# Patient Record
Sex: Male | Born: 1985 | Race: Black or African American | Hispanic: No | Marital: Married | Smoking: Never smoker
Health system: Southern US, Community
[De-identification: ages and names within clinical notes are randomized; demographics above are authoritative.]

## PROBLEM LIST (undated history)

## (undated) DIAGNOSIS — F419 Anxiety disorder, unspecified: Secondary | ICD-10-CM

## (undated) DIAGNOSIS — Z5189 Encounter for other specified aftercare: Secondary | ICD-10-CM

## (undated) DIAGNOSIS — K602 Anal fissure, unspecified: Secondary | ICD-10-CM

## (undated) DIAGNOSIS — R569 Unspecified convulsions: Secondary | ICD-10-CM

## (undated) DIAGNOSIS — F329 Major depressive disorder, single episode, unspecified: Secondary | ICD-10-CM

## (undated) DIAGNOSIS — T7840XA Allergy, unspecified, initial encounter: Secondary | ICD-10-CM

## (undated) DIAGNOSIS — E7800 Pure hypercholesterolemia, unspecified: Secondary | ICD-10-CM

## (undated) DIAGNOSIS — F32A Depression, unspecified: Secondary | ICD-10-CM

## (undated) HISTORY — DX: Encounter for other specified aftercare: Z51.89

## (undated) HISTORY — DX: Allergy, unspecified, initial encounter: T78.40XA

## (undated) HISTORY — DX: Major depressive disorder, single episode, unspecified: F32.9

## (undated) HISTORY — DX: Depression, unspecified: F32.A

## (undated) HISTORY — DX: Unspecified convulsions: R56.9

## (undated) HISTORY — PX: MOUTH SURGERY: SHX715

## (undated) HISTORY — DX: Anxiety disorder, unspecified: F41.9

## (undated) HISTORY — DX: Anal fissure, unspecified: K60.2

---

## 2007-05-23 ENCOUNTER — Emergency Department (HOSPITAL_COMMUNITY): Admission: EM | Admit: 2007-05-23 | Discharge: 2007-05-24 | Payer: Self-pay | Admitting: Emergency Medicine

## 2007-05-25 ENCOUNTER — Emergency Department (HOSPITAL_COMMUNITY): Admission: EM | Admit: 2007-05-25 | Discharge: 2007-05-25 | Payer: Self-pay | Admitting: Emergency Medicine

## 2007-07-27 ENCOUNTER — Emergency Department (HOSPITAL_COMMUNITY): Admission: EM | Admit: 2007-07-27 | Discharge: 2007-07-28 | Payer: Self-pay | Admitting: Family Medicine

## 2011-01-28 LAB — URINALYSIS, ROUTINE W REFLEX MICROSCOPIC
Glucose, UA: NEGATIVE
Hgb urine dipstick: NEGATIVE
Ketones, ur: >80 — AB
Leukocytes, UA: NEGATIVE
Nitrite: NEGATIVE
Protein, ur: 30 — AB
Specific Gravity, Urine: 1.031 — ABNORMAL HIGH
Urobilinogen, UA: >8 — ABNORMAL HIGH
pH: 6.5

## 2011-01-28 LAB — DIFFERENTIAL
Basophils Absolute: 0
Basophils Relative: 0
Eosinophils Absolute: 0
Eosinophils Relative: 0
Lymphocytes Relative: 5 — ABNORMAL LOW
Lymphs Abs: 0.5 — ABNORMAL LOW
Monocytes Absolute: 1.7 — ABNORMAL HIGH
Monocytes Relative: 18 — ABNORMAL HIGH
Neutro Abs: 7.4
Neutrophils Relative %: 77

## 2011-01-28 LAB — URINE MICROSCOPIC-ADD ON

## 2011-01-28 LAB — COMPREHENSIVE METABOLIC PANEL
ALT: 17
AST: 19
Albumin: 3.7
Alkaline Phosphatase: 56
BUN: 11
CO2: 25
Calcium: 9.1
Chloride: 104
Creatinine, Ser: 0.97
GFR calc non Af Amer: >60
Glucose, Bld: 105 — ABNORMAL HIGH
Potassium: 4.2
Sodium: 136
Total Bilirubin: 1.3 — ABNORMAL HIGH
Total Protein: 6.7

## 2011-01-28 LAB — CBC
HCT: 38.5 — ABNORMAL LOW
Hemoglobin: 13.1
MCHC: 34
MCV: 91.8
Platelets: 198
RBC: 4.19 — ABNORMAL LOW
RDW: 11.7
WBC: 9.6

## 2011-01-28 LAB — RAPID STREP SCREEN (MED CTR MEBANE ONLY): Streptococcus, Group A Screen (Direct): NEGATIVE

## 2011-01-28 LAB — LIPASE, BLOOD: Lipase: 18

## 2011-01-28 LAB — MONONUCLEOSIS SCREEN: Mono Screen: NEGATIVE

## 2011-01-31 LAB — POCT I-STAT, CHEM 8
BUN: 17
Calcium, Ion: 1.18
Chloride: 101
Creatinine, Ser: 1.1
Glucose, Bld: 89
HCT: 44
Hemoglobin: 15
Potassium: 3.9
Sodium: 135
TCO2: 25

## 2011-01-31 LAB — URINE MICROSCOPIC-ADD ON

## 2011-01-31 LAB — DIFFERENTIAL
Basophils Absolute: 0
Basophils Relative: 0
Eosinophils Absolute: 0
Eosinophils Relative: 0
Lymphocytes Relative: 18
Lymphs Abs: 1.1
Monocytes Absolute: 1.6 — ABNORMAL HIGH
Monocytes Relative: 27 — ABNORMAL HIGH
Neutro Abs: 3.2
Neutrophils Relative %: 55

## 2011-01-31 LAB — I-STAT 8, (EC8 V) (CONVERTED LAB)
BUN: 16
Bicarbonate: 24.1 — ABNORMAL HIGH
Chloride: 103
Glucose, Bld: 87
HCT: 43
Hemoglobin: 14.6
Operator id: 294511
Potassium: 4.2
Sodium: 134 — ABNORMAL LOW
TCO2: 25
pCO2, Ven: 37.5 — ABNORMAL LOW
pH, Ven: 7.416 — ABNORMAL HIGH

## 2011-01-31 LAB — CBC
HCT: 40.3
Hemoglobin: 13.8
MCHC: 34.2
MCV: 92
Platelets: 188
RBC: 4.39
RDW: 11.9
WBC: 5.9

## 2011-01-31 LAB — URINALYSIS, ROUTINE W REFLEX MICROSCOPIC
Glucose, UA: NEGATIVE
Hgb urine dipstick: NEGATIVE
Ketones, ur: 40 — AB
Leukocytes, UA: NEGATIVE
Nitrite: NEGATIVE
Protein, ur: 30 — AB
Specific Gravity, Urine: 1.033 — ABNORMAL HIGH
Urobilinogen, UA: 4 — ABNORMAL HIGH
pH: 6.5

## 2011-01-31 LAB — MONONUCLEOSIS SCREEN: Mono Screen: NEGATIVE

## 2011-01-31 LAB — POCT I-STAT CREATININE
Creatinine, Ser: 1
Operator id: 294511

## 2013-04-15 ENCOUNTER — Ambulatory Visit (INDEPENDENT_AMBULATORY_CARE_PROVIDER_SITE_OTHER): Payer: BC Managed Care – PPO | Admitting: Physician Assistant

## 2013-04-15 VITALS — BP 106/60 | HR 93 | Temp 98.5°F | Resp 18 | Ht 72.5 in | Wt 137.0 lb

## 2013-04-15 DIAGNOSIS — J069 Acute upper respiratory infection, unspecified: Secondary | ICD-10-CM

## 2013-04-15 MED ORDER — IPRATROPIUM BROMIDE 0.03 % NA SOLN: 2.0000 | Freq: Two times a day (BID) | NASAL | Status: DC

## 2013-04-15 MED ORDER — BENZONATATE 100 MG PO CAPS: 100.0000 mg | ORAL_CAPSULE | Freq: Three times a day (TID) | ORAL | Status: DC | PRN

## 2013-04-15 NOTE — Progress Notes (Signed)
Subjective:    Patient ID: Ralph Snow, male    DOB: 01/15/1986, 27 y.o.   MRN: [id number]  HPI   Mr. Warmuth is a very pleasant 27 yr old male here with concern for illness.  He reports that he feels that the worst has past, but that he continues to have some symptoms.  First became ill 4 days ago - symptoms include HA, congestion, scratchy throat, cough, body aches.  Low grade fever, tmax 99.84F but feels like his temperature was higher before that but he just hadn't taken his temperature yet.  Used migraine medicine for symptoms.  Continues to have some cough.  Nausea but no vomiting.  Appetite down but staying hydrated.  No known sick contacts.  No flu shot this year, declines today.  Works for [employer] in [place], wants to make sure he is ok to RTW.     Review of Systems  Constitutional: Negative for fever and chills.  HENT: Positive for congestion, rhinorrhea and sore throat. Negative for ear pain.   Respiratory: Positive for cough. Negative for shortness of breath and wheezing.   Cardiovascular: Negative.   Gastrointestinal: Positive for nausea. Negative for vomiting and diarrhea.  Musculoskeletal: Negative.   Skin: Negative.   Neurological: Positive for headaches.       Objective:   Physical Exam  Vitals reviewed. Constitutional: He is oriented to person, place, and time. He appears well-developed and well-nourished. No distress.  HENT:  Head: Normocephalic and atraumatic.  Right Ear: Tympanic membrane and ear canal normal.  Left Ear: Tympanic membrane and ear canal normal.  Nose: Mucosal edema and rhinorrhea present.  Mouth/Throat: Uvula is midline, oropharynx is clear and moist and mucous membranes are normal.  Eyes: Conjunctivae are normal. No scleral icterus.  Neck: Neck supple.  Cardiovascular: Normal rate, regular rhythm and normal heart sounds.   Pulmonary/Chest: Effort normal and breath sounds normal. He has no wheezes. He has no rales.  Abdominal: Soft. There is no  tenderness.  Lymphadenopathy:    He has no cervical adenopathy.  Neurological: He is alert and oriented to person, place, and time.  Skin: Skin is warm and dry.  Psychiatric: He has a normal mood and affect. His behavior is normal.        Assessment & Plan:  Viral URI with cough - Plan: ipratropium (ATROVENT) 0.03 % nasal spray, benzonatate (TESSALON) 100 MG capsule   Mr. Quevedo is a pleasant 27 yr old male here with 4 days of URI symptoms, now resolving.  Suspect viral etiology.  Possible that this could have been influenza, but with symptoms improving I have not tested him and he is outside the window for treatment.  Encouraged continued supportive care.  Tessalon and Atrovent for symptoms.  Work note provided.  RTC if worsening or not improving.   Meds ordered this encounter  Medications  . ibuprofen (ADVIL) 200 MG tablet    Sig: Take 200 mg by mouth every 6 (six) hours as needed.  Marland Kitchen ipratropium (ATROVENT) 0.03 % nasal spray    Sig: Place 2 sprays into the nose 2 (two) times daily.    Dispense:  30 mL    Refill:  1    Order Specific Question:  Supervising Provider    Answer:  Ethelda Chick [[id number]]  . benzonatate (TESSALON) 100 MG capsule    Sig: Take 1-2 capsules (100-200 mg total) by mouth 3 (three) times daily as needed for cough.    Dispense:  40 capsule  Refill:  0    Order Specific Question:  Supervising Provider    Answer:  Ethelda Chick [[id number]]    Loleta Dicker MHS, PA-C [facility] 12/8/20143:50 PM

## 2013-04-15 NOTE — Patient Instructions (Signed)
Ipratropium (Atrovent) nasal spray 2-3 times daily for relief of nasal congestion and post-nasal drainage.  Benzonatate (Tessalon) every 8 hours as needed for cough  Continue ibuprofen if needed for headache  Plenty of fluids (water is best!) and rest.  If worsening (fever >101.19F, worsening cough, shortness of breath, worsening pain) or not improving, please let us know.     Upper Respiratory Infection, Adult An upper respiratory infection (URI) is also sometimes known as the common cold. The upper respiratory tract includes the nose, sinuses, throat, trachea, and bronchi. Bronchi are the airways leading to the lungs. Most people improve within 1 week, but symptoms can last up to 2 weeks. A residual cough may last even longer.  CAUSES Many different viruses can infect the tissues lining the upper respiratory tract. The tissues become irritated and inflamed and often become very moist. Mucus production is also common. A cold is contagious. You can easily spread the virus to others by oral contact. This includes kissing, sharing a glass, coughing, or sneezing. Touching your mouth or nose and then touching a surface, which is then touched by another person, can also spread the virus. SYMPTOMS  Symptoms typically develop 1 to 3 days after you come in contact with a cold virus. Symptoms vary from person to person. They may include:  Runny nose.  Sneezing.  Nasal congestion.  Sinus irritation.  Sore throat.  Loss of voice (laryngitis).  Cough.  Fatigue.  Muscle aches.  Loss of appetite.  Headache.  Low-grade fever. DIAGNOSIS  You might diagnose your own cold based on familiar symptoms, since most people get a cold 2 to 3 times a year. Your caregiver can confirm this based on your exam. Most importantly, your caregiver can check that your symptoms are not due to another disease such as strep throat, sinusitis, pneumonia, asthma, or epiglottitis. Blood tests, throat tests, and X-rays  are not necessary to diagnose a common cold, but they may sometimes be helpful in excluding other more serious diseases. Your caregiver will decide if any further tests are required. RISKS AND COMPLICATIONS  You may be at risk for a more severe case of the common cold if you smoke cigarettes, have chronic heart disease (such as heart failure) or lung disease (such as asthma), or if you have a weakened immune system. The very young and very old are also at risk for more serious infections. Bacterial sinusitis, middle ear infections, and bacterial pneumonia can complicate the common cold. The common cold can worsen asthma and chronic obstructive pulmonary disease (COPD). Sometimes, these complications can require emergency medical care and may be life-threatening. PREVENTION  The best way to protect against getting a cold is to practice good hygiene. Avoid oral or hand contact with people with cold symptoms. Wash your hands often if contact occurs. There is no clear evidence that vitamin C, vitamin E, echinacea, or exercise reduces the chance of developing a cold. However, it is always recommended to get plenty of rest and practice good nutrition. TREATMENT  Treatment is directed at relieving symptoms. There is no cure. Antibiotics are not effective, because the infection is caused by a virus, not by bacteria. Treatment may include:  Increased fluid intake. Sports drinks offer valuable electrolytes, sugars, and fluids.  Breathing heated mist or steam (vaporizer or shower).  Eating chicken soup or other clear broths, and maintaining good nutrition.  Getting plenty of rest.  Using gargles or lozenges for comfort.  Controlling fevers with ibuprofen or acetaminophen as directed  by your caregiver.  Increasing usage of your inhaler if you have asthma. Zinc gel and zinc lozenges, taken in the first 24 hours of the common cold, can shorten the duration and lessen the severity of symptoms. Pain medicines  may help with fever, muscle aches, and throat pain. A variety of non-prescription medicines are available to treat congestion and runny nose. Your caregiver can make recommendations and may suggest nasal or lung inhalers for other symptoms.  HOME CARE INSTRUCTIONS   Only take over-the-counter or prescription medicines for pain, discomfort, or fever as directed by your caregiver.  Use a warm mist humidifier or inhale steam from a shower to increase air moisture. This may keep secretions moist and make it easier to breathe.  Drink enough water and fluids to keep your urine clear or pale yellow.  Rest as needed.  Return to work when your temperature has returned to normal or as your caregiver advises. You may need to stay home longer to avoid infecting others. You can also use a face mask and careful hand washing to prevent spread of the virus. SEEK MEDICAL CARE IF:   After the first few days, you feel you are getting worse rather than better.  You need your caregiver's advice about medicines to control symptoms.  You develop chills, worsening shortness of breath, or brown or red sputum. These may be signs of pneumonia.  You develop yellow or brown nasal discharge or pain in the face, especially when you bend forward. These may be signs of sinusitis.  You develop a fever, swollen neck glands, pain with swallowing, or white areas in the back of your throat. These may be signs of strep throat. SEEK IMMEDIATE MEDICAL CARE IF:   You have a fever.  You develop severe or persistent headache, ear pain, sinus pain, or chest pain.  You develop wheezing, a prolonged cough, cough up blood, or have a change in your usual mucus (if you have chronic lung disease).  You develop sore muscles or a stiff neck. Document Released: 10/19/2000 Document Revised: 07/18/2011 Document Reviewed: 08/27/2010 [facility] Patient Information 2014 [place], [place].

## 2013-07-20 ENCOUNTER — Ambulatory Visit: Payer: BC Managed Care – PPO

## 2013-07-20 ENCOUNTER — Ambulatory Visit (INDEPENDENT_AMBULATORY_CARE_PROVIDER_SITE_OTHER): Payer: BC Managed Care – PPO | Admitting: Family Medicine

## 2013-07-20 VITALS — BP 104/68 | HR 75 | Temp 98.2°F | Resp 17 | Ht 72.5 in | Wt 143.2 lb

## 2013-07-20 DIAGNOSIS — S300XXA Contusion of lower back and pelvis, initial encounter: Secondary | ICD-10-CM

## 2013-07-20 DIAGNOSIS — K5900 Constipation, unspecified: Secondary | ICD-10-CM

## 2013-07-20 DIAGNOSIS — J309 Allergic rhinitis, unspecified: Secondary | ICD-10-CM

## 2013-07-20 DIAGNOSIS — M778 Other enthesopathies, not elsewhere classified: Secondary | ICD-10-CM

## 2013-07-20 DIAGNOSIS — M545 Low back pain, unspecified: Secondary | ICD-10-CM

## 2013-07-20 DIAGNOSIS — G4710 Hypersomnia, unspecified: Secondary | ICD-10-CM

## 2013-07-20 DIAGNOSIS — R0683 Snoring: Secondary | ICD-10-CM

## 2013-07-20 DIAGNOSIS — R0989 Other specified symptoms and signs involving the circulatory and respiratory systems: Secondary | ICD-10-CM

## 2013-07-20 DIAGNOSIS — M79609 Pain in unspecified limb: Secondary | ICD-10-CM

## 2013-07-20 DIAGNOSIS — M79643 Pain in unspecified hand: Secondary | ICD-10-CM

## 2013-07-20 DIAGNOSIS — M779 Enthesopathy, unspecified: Secondary | ICD-10-CM

## 2013-07-20 DIAGNOSIS — R0609 Other forms of dyspnea: Secondary | ICD-10-CM

## 2013-07-20 LAB — POCT CBC
Granulocyte percent: 46.8 %G (ref 37–80)
HCT, POC: 42.9 % — AB (ref 43.5–53.7)
Hemoglobin: 13.8 g/dL — AB (ref 14.1–18.1)
Lymph, poc: 2.2 (ref 0.6–3.4)
MCH, POC: 30.6 pg (ref 27–31.2)
MCHC: 32.2 g/dL (ref 31.8–35.4)
MCV: 95.2 fL (ref 80–97)
MID (cbc): 0.4 (ref 0–0.9)
MPV: 8.9 fL (ref 0–99.8)
POC Granulocyte: 2.2 (ref 2–6.9)
POC LYMPH PERCENT: 45.5 %L (ref 10–50)
POC MID %: 7.7 %M (ref 0–12)
Platelet Count, POC: 260 10*3/uL (ref 142–424)
RBC: 4.51 M/uL — AB (ref 4.69–6.13)
RDW, POC: 12.3 %
WBC: 4.8 10*3/uL (ref 4.6–10.2)

## 2013-07-20 LAB — POCT URINALYSIS DIPSTICK
Bilirubin, UA: NEGATIVE
Blood, UA: NEGATIVE
Glucose, UA: NEGATIVE
Ketones, UA: NEGATIVE
Leukocytes, UA: NEGATIVE
Nitrite, UA: NEGATIVE
Spec Grav, UA: 1.02
Urobilinogen, UA: 1
pH, UA: 7

## 2013-07-20 MED ORDER — NAPROXEN 500 MG PO TABS: 500.0000 mg | ORAL_TABLET | Freq: Two times a day (BID) | ORAL | Status: DC

## 2013-07-20 MED ORDER — FLUTICASONE PROPIONATE 50 MCG/ACT NA SUSP: 2.0000 | Freq: Every day | NASAL | Status: DC

## 2013-07-20 NOTE — Patient Instructions (Signed)

## 2013-07-20 NOTE — Progress Notes (Addendum)
Subjective:    Patient ID: Ralph Snow, male    DOB: 10-Sep-1985, 28 y.o.   MRN: [id number]  HPI Chief Complaint  Patient presents with   Back Injury   Sleeping Problem   This chart was scribed for Ethelda Chick, MD by Andrew Au, ED Scribe. This patient was seen in room 1 and the patient's care was started at 4:28 PM.  HPI Comments: Ralph Snow is a 28 y.o. male who presents to the [facility] complaining of back injury onset 6-7 days ago. Pt states that he fell backward with significant force and hit his back on the corner of a cabinet. He reports soreness and pain with movement to lower back and flank. He denies radiation to legs, numbness or tingling. Pt took tylenol this morning for back pain. Pt report chronic constipation with recent worsening. Pt reports that he has been going every other day. Pt does not drink plenty of water. Pt does eat food with high fibers and fruit. Pt denies emesis, nausea, and diarrhea.  Pt is also complaining of left fourth finger pain. He reports pain with bending his finger. Pt denies injury to finger or hand. Pt is unsure where it came from. He states that he denies swelling, numbness and tingling. Pt writes with his right hand. He denies taking medication. He reports that his finger currently feels better than how it felt earlier this week.    Pt also complains of trouble sleeping. Pt reports that he does not get enough sleep. Pt feels like he does not feel rested in the morning even after 7 hours of sleep . He denies having trouble falling asleep. Pt states that he snores. Pt states that he has congestion in which he uses nose strips when sleeping. Pt denies napping during the day. Pt states he falls asleep during car rides. Pt reports falling asleep while having a conversation, but states that this does not occur often.  Pt denies chronic illness, asthma. Pt had seizures when he was younger and states that his last episode was when  he was 11 or 12. He denies hospitalization. Pt reports his father has anxiety problem. Pt reports mother has had surgery on thyroid. Pt reports healthy sisters and brother. Pt does not smoke, drink or do drugs. Pt works for [employer].  Pt is married.   Past Medical History  Diagnosis Date   Anxiety    Blood transfusion without reported diagnosis    Seizures    No Known Allergies Prior to Admission medications   Medication Sig Start Date End Date Taking? Authorizing Provider  acetaminophen (TYLENOL) 325 MG tablet Take 650 mg by mouth every 6 (six) hours as needed.   Yes Historical Provider, MD   Review of Systems  Gastrointestinal: Positive for abdominal pain and constipation. Negative for nausea, vomiting, diarrhea and blood in stool.  Genitourinary: Positive for flank pain.  Musculoskeletal: Positive for arthralgias and back pain.  Neurological: Negative for weakness and numbness.  Psychiatric/Behavioral: Positive for sleep disturbance.   History   Social History   Marital Status: Single    Spouse Name: N/A    Number of Children: N/A   Years of Education: N/A   Occupational History   employed     Education officer, community   Social History Main Topics   Smoking status: Never Smoker    Smokeless tobacco: Not on file   Alcohol Use: No   Drug Use: No  Sexual Activity: Yes   Other Topics Concern   Not on file   Social History Narrative   No narrative on file   Family History  Problem Relation Age of Onset   Anxiety disorder Father        Objective:   Physical Exam  Nursing note and vitals reviewed. Constitutional: He is oriented to person, place, and time. He appears well-developed and well-nourished. No distress.  HENT:  Head: Normocephalic and atraumatic.  Mouth/Throat: Oropharynx is clear and moist.  Eyes: Conjunctivae and EOM are normal. Pupils are equal, round, and reactive to light.  Neck: Normal range of motion. Neck  supple. No tracheal deviation present. No thyromegaly present.  Cardiovascular: Normal rate, regular rhythm and normal heart sounds.   No murmur heard. Pulmonary/Chest: Effort normal and breath sounds normal. No respiratory distress. He has no wheezes. He has no rales.  Abdominal: Soft. Bowel sounds are normal. He exhibits no distension and no mass. There is tenderness in the right lower quadrant. There is no rebound and no guarding.  Musculoskeletal: Normal range of motion.       Lumbar back: He exhibits tenderness, bony tenderness and pain. He exhibits normal range of motion and no spasm.  L HAND: +Fourth MCP of left hand tender to palpitation without swelling, ecchymoses. Full extension and flexion of fourth digit LUMBAR SPINE: +Superficial abrasion at L2 area; +Mild tenderness to palpitation right paraspinal region; straight leg raises negative; toe and heel walking intact; marching intact.  Lymphadenopathy:    He has no cervical adenopathy.  Neurological: He is alert and oriented to person, place, and time.  Skin: Skin is warm and dry.  Psychiatric: He has a normal mood and affect. His behavior is normal.      Results for orders placed in visit on 07/20/13  POCT CBC      Result Value Ref Range   WBC 4.8  4.6 - 10.2 K/uL   Lymph, poc 2.2  0.6 - 3.4   POC LYMPH PERCENT 45.5  10 - 50 %L   MID (cbc) 0.4  0 - 0.9   POC MID % 7.7  0 - 12 %M   POC Granulocyte 2.2  2 - 6.9   Granulocyte percent 46.8  37 - 80 %G   RBC 4.51 (*) 4.69 - 6.13 M/uL   Hemoglobin 13.8 (*) 14.1 - 18.1 g/dL   HCT, POC 16.142.9 (*) 09.643.5 - 53.7 %   MCV 95.2  80 - 97 fL   MCH, POC 30.6  27 - 31.2 pg   MCHC 32.2  31.8 - 35.4 g/dL   RDW, POC 04.512.3     Platelet Count, POC 260  142 - 424 K/uL   MPV 8.9  0 - 99.8 fL  POCT URINALYSIS DIPSTICK      Result Value Ref Range   Color, UA yellow     Clarity, UA clear     Glucose, UA neg     Bilirubin, UA neg     Ketones, UA neg     Spec Grav, UA 1.020     Blood, UA neg       pH, UA 7.0     Protein, UA trace     Urobilinogen, UA 1.0     Nitrite, UA neg     Leukocytes, UA Negative     [facility] reading (PRIMARY) by  Dr. Katrinka BlazingSmith.  LUMBAR SPINE:  NAD.    Assessment & Plan:   1. Lower back pain  2. Hand pain   3. Snoring   4. Hypersomnolence     1.  Low back pain/contusion:  New.  Supportive care with rest, ice, stretches, NSAIDs. Rx for Naproxen 500mg  bid with food provided. 2.  L hand pain/L hand tendonitis:  New.  Rx for Naproxen provided; recommend rest, NSAIDs, avoid overuse. 3.  Snoring: New. Associated with nasal congestion, hypersomnolence.  Recommend sleep study. 4.  Hypersomnolence:  New.  Recommend sleep study; obtain labs.  Associated with snoring. 5. Allergic Rhinitis:  New.  Rx for Flonase provided. 6. Constipation: worsening; recommend increased water intake and stool softener as needed.  I personally performed the services described in this documentation, which was scribed in my presence.  The recorded information has been reviewed and is accurate.  Nilda Simmer, M.D.  [facility] [address] [place], [place]  [address] [phone] phone [phone] fax

## 2013-07-21 LAB — COMPREHENSIVE METABOLIC PANEL
ALT: 12 U/L (ref 0–53)
AST: 18 U/L (ref 0–37)
Albumin: 4.3 g/dL (ref 3.5–5.2)
Alkaline Phosphatase: 54 U/L (ref 39–117)
BUN: 17 mg/dL (ref 6–23)
CO2: 28 mEq/L (ref 19–32)
Calcium: 9.3 mg/dL (ref 8.4–10.5)
Chloride: 105 mEq/L (ref 96–112)
Creat: 0.87 mg/dL (ref 0.50–1.35)
Glucose, Bld: 82 mg/dL (ref 70–99)
Potassium: 3.9 mEq/L (ref 3.5–5.3)
Sodium: 140 mEq/L (ref 135–145)
Total Bilirubin: 0.5 mg/dL (ref 0.2–1.2)
Total Protein: 7.1 g/dL (ref 6.0–8.3)

## 2013-07-21 LAB — TSH: TSH: 0.708 u[IU]/mL (ref 0.350–4.500)

## 2013-08-21 ENCOUNTER — Ambulatory Visit (INDEPENDENT_AMBULATORY_CARE_PROVIDER_SITE_OTHER): Payer: BC Managed Care – PPO | Admitting: Family Medicine

## 2013-08-21 VITALS — BP 128/70 | HR 82 | Temp 98.6°F | Resp 16 | Ht 72.5 in | Wt 143.0 lb

## 2013-08-21 DIAGNOSIS — K120 Recurrent oral aphthae: Secondary | ICD-10-CM

## 2013-08-21 MED ORDER — CHLORHEXIDINE GLUCONATE SOLN: 10.0000 mL | Freq: Two times a day (BID) | Status: DC

## 2013-08-21 NOTE — Progress Notes (Signed)
This chart was scribed for [name], MD by Nicholos Johnsenise Iheanachor, Medical Scribe. This patient's care was started at 5:56 PM.  Patient ID: Ralph Snow MRN: [id number], DOB: Nov 25, 1985, 28 y.o. Date of Encounter: 08/21/2013, 5:56 PM  Primary Physician: No PCP Per Patient  Chief Complaint: mouth sores  HPI: 28 y.o. year old male with history below presents with sores on the inside on the mouth; has had since highschool that are recurrent but first time it has ever appeared 2 at a time. Hurts worse when brushing teeth and intermittently when eating. Does not report any sores or pain down the back of the throat. Reports he did not start paying attention to his health until he and his wife got together. Works for [employer].    Past Medical History  Diagnosis Date   Anxiety    Blood transfusion without reported diagnosis    Seizures     last seizure age [age].     Home Meds: Prior to Admission medications   Medication Sig Start Date End Date Taking? Authorizing Provider  fluticasone (FLONASE) 50 MCG/ACT nasal spray Place 2 sprays into both nostrils daily. 07/20/13  Yes Ethelda ChickKristi M Smith, MD  acetaminophen (TYLENOL) 325 MG tablet Take 650 mg by mouth every 6 (six) hours as needed.    Historical Provider, MD  naproxen (NAPROSYN) 500 MG tablet Take 1 tablet (500 mg total) by mouth 2 (two) times daily with a meal. 07/20/13   Ethelda ChickKristi M Smith, MD    Allergies: No Known Allergies  History   Social History   Marital Status: Single    Spouse Name: N/A    Number of Children: N/A   Years of Education: N/A   Occupational History   employed     Education officer, [employer]   Social History Main Topics   Smoking status: Never Smoker    Smokeless tobacco: Not on file   Alcohol Use: No   Drug Use: No   Sexual Activity: Yes   Other Topics Concern   Not on file   Social History Narrative   No narrative on file     Review of Systems: Constitutional: negative for  chills, fever, night sweats, weight changes, or fatigue  HEENT: negative for vision changes, hearing loss, congestion, rhinorrhea, ST, epistaxis, or sinus pressure. Positive for mouth sores. Cardiovascular: negative for chest pain or palpitations Respiratory: negative for hemoptysis, wheezing, shortness of breath, or cough Abdominal: negative for abdominal pain, nausea, vomiting, diarrhea, or constipation Dermatological: negative for rash Neurologic: negative for headache, dizziness, or syncope All other systems reviewed and are otherwise negative with the exception to those above and in the HPI.   Physical Exam Blood pressure 128/70, pulse 82, temperature 98.6 F (37 C), temperature source Oral, resp. rate 16, height 6' 0.5" (1.842 m), weight 143 lb (64.864 kg), SpO2 98.00%., Body mass index is 19.12 kg/(m^2). General: Well developed, well nourished, in no acute distress. Head: Normocephalic, atraumatic, eyes without discharge, sclera non-icteric, nares are without discharge. Bilateral auditory canals clear, TM's are without perforation, pearly grey and translucent with reflective cone of light bilaterally. Oral cavity moist, posterior pharynx without exudate, erythema, peritonsillar abscess, or post nasal drip.   Neck: Supple. No thyromegaly. Full ROM. No lymphadenopathy. Lungs: Clear bilaterally to auscultation without wheezes, rales, or rhonchi. Breathing is unlabored. Heart: RRR with S1 S2. No murmurs, rubs, or gallops appreciated. Abdomen: Soft, non-tender, non-distended with normoactive bowel sounds. No hepatomegaly. No rebound/guarding. No obvious abdominal masses. Msk:  Strength and tone normal for age. Extremities/Skin: Warm and dry. No clubbing or cyanosis. No edema. No rashes or suspicious lesions. Neuro: Alert and oriented X 3. Moves all extremities spontaneously. Gait is normal. CNII-XII grossly in tact. Psych:  Responds to questions appropriately with a normal affect.   Two 3  mm aphthae in left cheek   ASSESSMENT AND PLAN:  28 y.o. year old male with Aphthous stomatitis - Plan: CHLORHEXIDINE GLUCONATE, BULK, SOLN     Signed, [name], MD 08/21/2013 5:56 PM

## 2013-08-21 NOTE — Patient Instructions (Addendum)
Pick up L-lysine tablets and take one daily for 2 months.   Canker Sores  Canker sores are painful, open sores on the inside of the mouth and cheek. They may be white or yellow. The sores usually heal in 1 to 2 weeks. Women are more likely than men to have recurrent canker sores. CAUSES The cause of canker sores is not well understood. More than one cause is likely. Canker sores do not appear to be caused by certain types of germs (viruses or bacteria). Canker sores may be caused by:  An allergic reaction to certain foods.  Digestive problems.  Not having enough vitamin B12, folic acid, and iron.  Male sex hormones. Sores may come only during certain phases of a menstrual cycle. Often, there is improvement during pregnancy.  Genetics. Some people seem to inherit canker sore problems. Emotional stress and injuries to the mouth may trigger outbreaks, but not cause them.  DIAGNOSIS Canker sores are diagnosed by exam.  TREATMENT  Patients who have frequent bouts of canker sores may have cultures taken of the sores, blood tests, or allergy tests. This helps determine if their sores are caused by a poor diet, an allergy, or some other preventable or treatable disease.  Vitamins may prevent recurrences or reduce the severity of canker sores in people with poor nutrition.  Numbing ointments can relieve pain. These are available in drug stores without a prescription.  Anti-inflammatory steroid mouth rinses or gels may be prescribed by your caregiver for severe sores.  Oral steroids may be prescribed if you have severe, recurrent canker sores. These strong medicines can cause many side effects and should be used only under the close direction of a dentist or physician.  Mouth rinses containing the antibiotic medicine may be prescribed. They may lessen symptoms and speed healing. Healing usually happens in about 1 or 2 weeks with or without treatment. Certain antibiotic mouth rinses given to  pregnant women and young children can permanently stain teeth. Talk to your caregiver about your treatment. HOME CARE INSTRUCTIONS   Avoid foods that cause canker sores for you.  Avoid citrus juices, spicy or salty foods, and coffee until the sores are healed.  Use a soft-bristled toothbrush.  Chew your food carefully to avoid biting your cheek.  Apply topical numbing medicine to the sore to help relieve pain.  Apply a thin paste of baking soda and water to the sore to help heal the sore.  Only use mouth rinses or medicines for pain or discomfort as directed by your caregiver. SEEK MEDICAL CARE IF:   Your symptoms are not better in 1 week.  Your sores are still present after 2 weeks.  Your sores are very painful.  You have trouble breathing or swallowing.  Your sores come back frequently. Document Released: 08/20/2010 Document Revised: 08/20/2012 Document Reviewed: 08/20/2010 [facility] Patient Information 2014 [place].

## 2013-12-21 ENCOUNTER — Ambulatory Visit (INDEPENDENT_AMBULATORY_CARE_PROVIDER_SITE_OTHER): Payer: BC Managed Care – PPO | Admitting: Emergency Medicine

## 2013-12-21 VITALS — BP 120/64 | HR 88 | Temp 98.1°F | Resp 16 | Ht 72.5 in | Wt 137.0 lb

## 2013-12-21 DIAGNOSIS — K120 Recurrent oral aphthae: Secondary | ICD-10-CM

## 2013-12-21 LAB — RPR

## 2013-12-21 LAB — HIV ANTIBODY (ROUTINE TESTING W REFLEX): HIV 1&2 Ab, 4th Generation: NONREACTIVE

## 2013-12-21 NOTE — Addendum Note (Signed)
Addended by: Carmelina DaneANDERSON, Jaziya Obarr S on: 12/21/2013 10:26 AM   Modules accepted: Orders

## 2013-12-21 NOTE — Progress Notes (Signed)
[facility] [address], [place] [address] [phone]  Date:  12/21/2013   Name:  Ralph Hakearon A Eversley   DOB:  09-08-1985   MRN:  [id number]  PCP:  No PCP Per Patient    Chief Complaint: Rash   History of Present Illness:  Ralph Snow is a 28 y.o. very pleasant male patient who presents with the following:  Has a one week history of painful lesions on lip.  History of aphthous ulcers.  No antecedent illness or injury. No improvement with over the counter medications or other home remedies.  Denies other complaint or health concern today.   There are no active problems to display for this patient.   Past Medical History  Diagnosis Date  . Anxiety   . Blood transfusion without reported diagnosis   . Seizures     last seizure age 28.    History reviewed. No pertinent past surgical history.  History  Substance Use Topics  . Smoking status: Never Smoker   . Smokeless tobacco: Never Used  . Alcohol Use: No    Family History  Problem Relation Age of Onset  . Anxiety disorder Father     No Known Allergies  Medication list has been reviewed and updated.  Current Outpatient Prescriptions on File Prior to Visit  Medication Sig Dispense Refill  . acetaminophen (TYLENOL) 325 MG tablet Take 650 mg by mouth every 6 (six) hours as needed.      . CHLORHEXIDINE GLUCONATE, BULK, SOLN Take 10 mLs by mouth 2 (two) times daily.  240 mL  0  . fluticasone (FLONASE) 50 MCG/ACT nasal spray Place 2 sprays into both nostrils daily.  16 g  11  . naproxen (NAPROSYN) 500 MG tablet Take 1 tablet (500 mg total) by mouth 2 (two) times daily with a meal.  30 tablet  0   No current facility-administered medications on file prior to visit.    Review of Systems:  As per HPI, otherwise negative.    Physical Examination: Filed Vitals:   12/21/13 0925  BP: 120/64  Pulse: 88  Temp: 98.1 F (36.7 C)  Resp: 16   Filed Vitals:   12/21/13 0925  Height: 6' 0.5"  (1.842 m)  Weight: 137 lb (62.143 kg)   Body mass index is 18.32 kg/(m^2). Ideal Body Weight: Weight in (lb) to have BMI = 25: 186.5   GEN: WDWN, NAD, Non-toxic, Alert & Oriented x 3 HEENT: Atraumatic, Normocephalic.  Ears and Nose: No external deformity. EXTR: No clubbing/cyanosis/edema NEURO: Normal gait.  PSYCH: Normally interactive. Conversant. Not depressed or anxious appearing.  Calm demeanor.  Aphthous ulceration upper right lip.  Assessment and Plan: Aphthous ulcer Discussed lack of effective treatment Offered RPR and he declined  Signed,  Phillips OdorJeffery Mayelin Panos, MD

## 2013-12-21 NOTE — Patient Instructions (Signed)
Canker Sores  °Canker sores are painful, open sores on the inside of the mouth and cheek. They may be white or yellow. The sores usually heal in 1 to 2 weeks. Women are more likely than men to have recurrent canker sores. °CAUSES °The cause of canker sores is not well understood. More than one cause is likely. Canker sores do not appear to be caused by certain types of germs (viruses or bacteria). Canker sores may be caused by: °· An allergic reaction to certain foods. °· Digestive problems. °· Not having enough vitamin B12, folic acid, and iron. °· Male sex hormones. Sores may come only during certain phases of a menstrual cycle. Often, there is improvement during pregnancy. °· Genetics. Some people seem to inherit canker sore problems. °Emotional stress and injuries to the mouth may trigger outbreaks, but not cause them.  °DIAGNOSIS °Canker sores are diagnosed by exam.  °TREATMENT °· Patients who have frequent bouts of canker sores may have cultures taken of the sores, blood tests, or allergy tests. This helps determine if their sores are caused by a poor diet, an allergy, or some other preventable or treatable disease. °· Vitamins may prevent recurrences or reduce the severity of canker sores in people with poor nutrition. °· Numbing ointments can relieve pain. These are available in drug stores without a prescription. °· Anti-inflammatory steroid mouth rinses or gels may be prescribed by your caregiver for severe sores. °· Oral steroids may be prescribed if you have severe, recurrent canker sores. These strong medicines can cause many side effects and should be used only under the close direction of a dentist or physician. °· Mouth rinses containing the antibiotic medicine may be prescribed. They may lessen symptoms and speed healing. °Healing usually happens in about 1 or 2 weeks with or without treatment. Certain antibiotic mouth rinses given to pregnant women and young children can permanently stain teeth.  Talk to your caregiver about your treatment. °HOME CARE INSTRUCTIONS  °· Avoid foods that cause canker sores for you. °· Avoid citrus juices, spicy or salty foods, and coffee until the sores are healed. °· Use a soft-bristled toothbrush. °· Chew your food carefully to avoid biting your cheek. °· Apply topical numbing medicine to the sore to help relieve pain. °· Apply a thin paste of baking soda and water to the sore to help heal the sore. °· Only use mouth rinses or medicines for pain or discomfort as directed by your caregiver. °SEEK MEDICAL CARE IF:  °· Your symptoms are not better in 1 week. °· Your sores are still present after 2 weeks. °· Your sores are very painful. °· You have trouble breathing or swallowing. °· Your sores come back frequently. °Document Released: 08/20/2010 Document Revised: 08/20/2012 Document Reviewed: 08/20/2010 °ExitCare® Patient Information ©2015 [employer]. This information is not intended to replace advice given to you by your health care provider. Make sure you discuss any questions you have with your health care provider. ° °

## 2013-12-23 ENCOUNTER — Telehealth: Payer: Self-pay | Admitting: *Deleted

## 2013-12-23 NOTE — Telephone Encounter (Signed)
Pt called to inquire about lab results from Saturday. Doesn't look like they have yet been reviewed. Please advise. Thanks

## 2013-12-24 LAB — HSV(HERPES SIMPLEX VRS) I + II AB-IGG
HSV 1 Glycoprotein G Ab, IgG: 0.25 IV
HSV 2 Glycoprotein G Ab, IgG: 0.33 IV

## 2013-12-24 NOTE — Telephone Encounter (Signed)
They are neg and a call is in lab results.

## 2014-09-17 ENCOUNTER — Ambulatory Visit (INDEPENDENT_AMBULATORY_CARE_PROVIDER_SITE_OTHER): Payer: BC Managed Care – PPO | Admitting: Physician Assistant

## 2014-09-17 VITALS — BP 124/80 | HR 93 | Temp 98.4°F | Resp 16 | Ht 72.0 in | Wt 146.4 lb

## 2014-09-17 DIAGNOSIS — J0100 Acute maxillary sinusitis, unspecified: Secondary | ICD-10-CM

## 2014-09-17 DIAGNOSIS — R590 Localized enlarged lymph nodes: Secondary | ICD-10-CM

## 2014-09-17 DIAGNOSIS — R599 Enlarged lymph nodes, unspecified: Secondary | ICD-10-CM

## 2014-09-17 DIAGNOSIS — R5383 Other fatigue: Secondary | ICD-10-CM | POA: Diagnosis not present

## 2014-09-17 MED ORDER — DOXYCYCLINE HYCLATE 100 MG PO CAPS: 100.0000 mg | ORAL_CAPSULE | Freq: Two times a day (BID) | ORAL | Status: DC

## 2014-09-17 NOTE — Progress Notes (Addendum)
   Subjective:    Patient ID: Ralph Snow, male    DOB: May 31, 1985, 29 y.o.   MRN: [id number]  Chief Complaint  Patient presents with  . Sore Throat    1 week  . Generalized Body Aches    1 week  . Cough    1 week  . Nasal Congestion    1 week   There are no active problems to display for this patient.  Medications, allergies, past medical history, surgical history, family history, social history and problem list reviewed and updated.  HPI  89 yom presents with sore throat, body aches, cough, nasal congestion.   Has hx allergies. Does not take anything for them. Head/nasal congestion started about 2 wks ago. Constant. Approx one week ago he got better for a few days then started feeling bad again with congestion, st, rhinorrhea, mild non prod cough. Has felt fatigued every morning past week. Generalized body aches past 4-5 days. Chills few days ago. Temp last week at home 98 twice. No fever today.   No known tick bites. No rashes.   No flu vaccine this year.   Review of Systems See HPI. No abd pain, n/v, diarrhea. No cp, sob. No HA. No neck pain.     Objective:   Physical Exam  Constitutional: He appears well-developed and well-nourished.  Non-toxic appearance. He does not have a sickly appearance. He does not appear ill. No distress.  BP 124/80 mmHg  Pulse 93  Temp(Src) 98.4 F (36.9 C) (Oral)  Resp 16  Ht 6' (1.829 m)  Wt 146 lb 6.4 oz (66.407 kg)  BMI 19.85 kg/m2  SpO2 97%   HENT:  Right Ear: A middle ear effusion is present.  Left Ear: A middle ear effusion is present.  Nose: Mucosal edema and rhinorrhea present. Right sinus exhibits no maxillary sinus tenderness and no frontal sinus tenderness. Left sinus exhibits maxillary sinus tenderness. Left sinus exhibits no frontal sinus tenderness.  Mouth/Throat: Uvula is midline, oropharynx is clear and moist and mucous membranes are normal.  Pulmonary/Chest: Effort normal and breath sounds normal. No tachypnea.    Lymphadenopathy:       Head (right side): Submandibular adenopathy present. No submental adenopathy present.       Head (left side): Submandibular adenopathy present. No submental adenopathy present.    He has cervical adenopathy.       Right cervical: Superficial cervical adenopathy present. No posterior cervical adenopathy present.      Left cervical: Superficial cervical and posterior cervical adenopathy present.  Tender anterior cervical LAN bilaterally.       Assessment & Plan:   [age] yom presents with sore throat, body aches, cough, nasal congestion.   Acute maxillary sinusitis, recurrence not specified - Plan: doxycycline (VIBRAMYCIN) 100 MG capsule --recommended proper allergic rhinitis tx for future prevention --> zyrtec, flonase --as is past day 10 of sx and had improvement then worsening again last week, will tx for bacterial etiology --doxy as concern for possible mono (avoid amox)  Anterior cervical lymphadenopathy - Plan: Epstein-Barr virus VCA antibody panel Other fatigue - Plan: Epstein-Barr virus VCA antibody panel --no tick bites, rash but doxy will cover RMSF as well --doubt flu with no fevers, gradual onset sx  Donnajean Lopesodd M. Ashwath Lasch, PA-C Physician Assistant-Certified [facility]  09/17/2014 3:12 PM

## 2014-09-17 NOTE — Patient Instructions (Signed)
Your congestion and symptoms most likely stemmed from your allergies.  Please be sure to use a daily antihistamine like claritin and daily flonase nasal spray during allergy season.  We have given you an antibiotic, please take the doxycycline twice daily for 10 days for possible bacterial infection. We are checking labs for mono today and will be in contact with you with those results.

## 2014-09-18 LAB — EPSTEIN-BARR VIRUS VCA ANTIBODY PANEL
EBV EA IgG: <5 U/mL (ref <9.0)
EBV NA IgG: 43 U/mL — ABNORMAL HIGH (ref <18.0)
EBV VCA IgG: 92.8 U/mL — ABNORMAL HIGH (ref <18.0)
EBV VCA IgM: <10 U/mL (ref <36.0)

## 2014-09-28 ENCOUNTER — Telehealth: Payer: Self-pay

## 2014-09-28 NOTE — Telephone Encounter (Signed)
PATIENT STATES HE CAME INTO THE OFFICE ABOUT 2 WEEKS AGO AND SAW TODD MCVEIGH FOR A SINUS INFECTION. HIS WIFE ASKED HIM IF IT WAS BACTERIAL OR VIRAL? HE WOULD LIKE TO FIND OUT WHICH ONE IT WAS BECAUSE HE DID NOT KNOW WHAT TO TELL HER. BEST PHONE [phone] (CELL) MBC

## 2014-09-29 ENCOUNTER — Telehealth: Payer: Self-pay

## 2014-09-29 NOTE — Telephone Encounter (Signed)
Ralph Snow talked to this pt and answered his questions.

## 2014-09-29 NOTE — Telephone Encounter (Signed)
Pt states he had taken his medicine for a little over 5 days and it started upsetting his stomach so he stopped taking it, would like to know if he would still be contagious Since he didn't continue. Please call [phone]

## 2014-09-29 NOTE — Telephone Encounter (Signed)
lmom that it looked like it was bacterial.

## 2015-02-06 ENCOUNTER — Ambulatory Visit (INDEPENDENT_AMBULATORY_CARE_PROVIDER_SITE_OTHER): Payer: BC Managed Care – PPO | Admitting: Physician Assistant

## 2015-02-06 VITALS — BP 112/64 | HR 80 | Temp 98.9°F | Resp 14 | Ht 73.5 in | Wt 149.0 lb

## 2015-02-06 DIAGNOSIS — L989 Disorder of the skin and subcutaneous tissue, unspecified: Secondary | ICD-10-CM | POA: Diagnosis not present

## 2015-02-06 DIAGNOSIS — J302 Other seasonal allergic rhinitis: Secondary | ICD-10-CM | POA: Insufficient documentation

## 2015-02-06 NOTE — Patient Instructions (Signed)
Use an OTC steroid cream or ointment when you get a bump that is swollen, red or tender. Consider using an exfoliant in the area where you shave to help prevent the ingrowing hairs.

## 2015-02-06 NOTE — Progress Notes (Signed)
   Patient ID: Ralph Snow, male    DOB: 1985/10/22, 29 y.o.   MRN: [id number]  PCP: No PCP Per Patient  Subjective:   Chief Complaint  Patient presents with  . Bumps    around penis    HPI Presents for evaluation of bumps near the penis.  Sometimes gets bumps when shaving or trimming around that area (indicates the mons pubis). Usually 1-2 at a time, but noticed that there are more now. Thought he may have seen a rash, "it looked a little bumpy." Since he has insurance, he thought he'd get it checked out.  2 small white bumps near the tip of the penis "for years", and he wonder what they are.  No urinary symptoms. No penile discharge. Monogamous sex with one partner x 2 years (his wife). No history of STI. HIV screening 12/2013 was negative.  Review of Systems  Constitutional: Negative for fever, activity change, appetite change and fatigue.  Gastrointestinal: Negative for nausea, diarrhea and constipation.  Genitourinary: Negative for dysuria, urgency, frequency, hematuria, discharge, penile swelling, scrotal swelling, penile pain and testicular pain.  Musculoskeletal: Negative for back pain.  Skin: Negative for rash.  Allergic/Immunologic: Negative for immunocompromised state.  Hematological: Negative for adenopathy. Does not bruise/bleed easily.       Patient Active Problem List   Diagnosis Date Noted  . Seasonal allergies 02/06/2015     Prior to Admission medications   Medication Sig Start Date End Date Taking? Authorizing Provider  acetaminophen (TYLENOL) 325 MG tablet Take 650 mg by mouth every 6 (six) hours as needed.    Historical Provider, MD  fluticasone (FLONASE) 50 MCG/ACT nasal spray Place 2 sprays into both nostrils daily. Patient not taking: Reported on 09/17/2014 07/20/13   Ethelda Chick, MD     No Known Allergies     Objective:  Physical Exam  Constitutional: He is oriented to person, place, and time. He appears well-developed and  well-nourished. He is active and cooperative. No distress.  BP 112/64 mmHg  Pulse 80  Temp(Src) 98.9 F (37.2 C) (Oral)  Resp 14  Ht 6' 1.5" (1.867 m)  Wt 149 lb (67.586 kg)  BMI 19.39 kg/m2  SpO2 97%   Eyes: Conjunctivae are normal.  Pulmonary/Chest: Effort normal.  Genitourinary: Testes normal and penis normal. Circumcised. No phimosis, paraphimosis, hypospadias, penile erythema or penile tenderness. No discharge found.  2 tiny pearly penile papules on the underside of the shaft adjacent to the corona. At the base of the shaft there are two resolving lesions consistent with folliculitis/"hair bumps." No erythema. No tenderness. Not fluctuant.  Lymphadenopathy:       Right: No inguinal adenopathy present.       Left: No inguinal adenopathy present.  Neurological: He is alert and oriented to person, place, and time.  Psychiatric: He has a normal mood and affect. His speech is normal and behavior is normal.           Assessment & Plan:   1. Skin lesion reassured that the lesions present do not represent STI. Anticipatory guidance.   Fernande Bras, PA-C Physician Assistant-Certified [facility]

## 2015-12-24 ENCOUNTER — Ambulatory Visit (INDEPENDENT_AMBULATORY_CARE_PROVIDER_SITE_OTHER): Payer: BC Managed Care – PPO | Admitting: Physician Assistant

## 2015-12-24 ENCOUNTER — Encounter: Payer: Self-pay | Admitting: Physician Assistant

## 2015-12-24 VITALS — BP 120/74 | HR 78 | Temp 98.1°F | Resp 16 | Ht 72.75 in | Wt 150.8 lb

## 2015-12-24 DIAGNOSIS — K602 Anal fissure, unspecified: Secondary | ICD-10-CM

## 2015-12-24 MED ORDER — DILTIAZEM GEL 2 %: CUTANEOUS | 0 refills | Status: DC

## 2015-12-24 MED ORDER — LIDOCAINE (ANORECTAL) 5 % EX GEL: 1.0000 "application " | Freq: Three times a day (TID) | CUTANEOUS | 0 refills | Status: DC

## 2015-12-24 NOTE — Patient Instructions (Addendum)
Put a pea size amount of each gel in your palm, rub it together and apply to affected area three times a day until symptoms resolve.  If symptoms worsen or do not improve, come back.   Anal Fissure, Adult An anal fissure is a small tear or crack in the skin around the opening of the butt (anus).Bleeding from the tear or crack usually stops on its own within a few minutes. The bleeding may happen every time you poop (have a bowel movement) until the tear or crack heals. HOME CARE Eating and Drinking  Avoid bananas and dairy products. These foods can make it hard to poop.  Drink enough fluid to keep your pee (urine) clear or pale yellow.  Eat a lot of fruit, whole grains, and vegetables. General Instructions  Keep the butt area as clean and dry as you can.  Take a warm water bath (sitz bath) as told by your doctor. Do not use soap.  Take over-the-counter and prescription medicines only as told by your doctor.  Use creams or ointments only as told by your doctor.  Keep all follow-up visits as told by your doctor. This is important. GET HELP IF:  You have more bleeding.  You have a fever.  You have watery poop (diarrhea) that is mixed with blood.  You have pain.  You problem gets worse, not better.   This information is not intended to replace advice given to you by your health care provider. Make sure you discuss any questions you have with your health care provider.   Document Released: 12/22/2010 Document Revised: 01/14/2015 Document Reviewed: 07/21/2014 Elsevier Interactive Patient Education 2016 [employer]     IF you received an x-ray today, you will receive an invoice from [facility]. Please contact [facility] at [phone] with questions or concerns regarding your invoice.   IF you received labwork today, you will receive an invoice from [facility]. Please contact [facility] at [phone] with questions or  concerns regarding your invoice.   Our billing staff will not be able to assist you with questions regarding bills from these companies.  You will be contacted with the lab results as soon as they are available. The fastest way to get your results is to activate your My Chart account. Instructions are located on the last page of this paperwork. If you have not heard from us regarding the results in 2 weeks, please contact this office.

## 2015-12-24 NOTE — Progress Notes (Signed)
   Ralph Snow  MRN: [id number] DOB: 1985-10-11  Subjective:  Ralph Snow is a 30 y.o. male seen in office today for a chief complaint of bright red blood in stool toilet  x 1 day. Pt noticed bright red blood on his stool and toilet paper after bowel movements about three weeks ago for a couple of days but thought it was due to diet so changed his diet and it went away. When it came back this morning, he was really concerned and wanted to come in and get it checked out.  Had associated abdominal discomfort this morning after bowel movement and a discomfort in his rectum while having bowel movements. Denies constipation, diarrhea, and black stools. His bowel movements are regular in frequency and consistency. Pt is eating a healthy well balanced diet.    After visualization of fissure, pt does recall having what he thought was an ingrown hair in this region three weeks ago. Pt notes he picked at it at this time. Did not notice any immediately bleeding but that is right before he started having blood on toilet paper after bowel movements.   Review of Systems  Constitutional: Negative for fatigue and fever.   Patient Active Problem List   Diagnosis Date Noted  . Seasonal allergies 02/06/2015   Current Outpatient Prescriptions on File Prior to Visit  Medication Sig Dispense Refill  . acetaminophen (TYLENOL) 325 MG tablet Take 650 mg by mouth every 6 (six) hours as needed.    . fluticasone (FLONASE) 50 MCG/ACT nasal spray Place 2 sprays into both nostrils daily. (Patient not taking: Reported on 09/17/2014) 16 g 11   No current facility-administered medications on file prior to visit.     No Known Allergies  Objective:  BP 120/74 (BP Location: Right Arm, Patient Position: Sitting, Cuff Size: Normal)   Pulse 78   Temp 98.1 F (36.7 C) (Oral)   Resp 16   Ht 6' 0.75" (1.848 m)   Wt 150 lb 12.8 oz (68.4 kg)   SpO2 98%   BMI 20.03 kg/m   Physical Exam  Constitutional: He is oriented to  person, place, and time and well-developed, well-nourished, and in no distress.  HENT:  Head: Normocephalic and atraumatic.  Eyes: Conjunctivae are normal.  Neck: Normal range of motion.  Pulmonary/Chest: Effort normal.  Genitourinary: Rectal exam shows fissure (0.75 cm at 7 o clock) and tenderness (minimal when fissure palpated). Rectal exam shows no external hemorrhoid.  Neurological: He is alert and oriented to person, place, and time. Gait normal.  Skin: Skin is warm and dry.  Psychiatric: Affect normal.  Vitals reviewed.  No DRE performed.   Assessment and Plan :   1. Anal fissure - diltiazem 2 % GEL; Apply a pea-sized amount to the affected area 3 times daily.  Dispense: 30 g; Refill: 0 - Lidocaine, Anorectal, 5 % GEL; Apply 1 application topically 3 (three) times daily. Apply a pea-sized amount to the affected area 3 times daily.  Dispense: 30 g; Refill: 0 -Return to clinic if symptoms worsen, do not improve, or as needed  Benjiman CoreBrittany Stacy Deshler PA-C  [facility] 12/24/2015 3:25 PM

## 2016-02-18 ENCOUNTER — Ambulatory Visit (INDEPENDENT_AMBULATORY_CARE_PROVIDER_SITE_OTHER): Payer: BC Managed Care – PPO | Admitting: Family Medicine

## 2016-02-18 VITALS — BP 122/60 | HR 89 | Temp 98.0°F | Resp 18 | Ht 73.0 in | Wt 152.2 lb

## 2016-02-18 DIAGNOSIS — R197 Diarrhea, unspecified: Secondary | ICD-10-CM

## 2016-02-18 DIAGNOSIS — R1031 Right lower quadrant pain: Secondary | ICD-10-CM | POA: Diagnosis not present

## 2016-02-18 LAB — POCT CBC
Granulocyte percent: 59.9 %G (ref 37–80)
HCT, POC: 40.2 % — AB (ref 43.5–53.7)
Hemoglobin: 14.2 g/dL (ref 14.1–18.1)
Lymph, poc: 1.5 (ref 0.6–3.4)
MCH, POC: 31.7 pg — AB (ref 27–31.2)
MCHC: 35.3 g/dL (ref 31.8–35.4)
MCV: 90 fL (ref 80–97)
MID (cbc): 0.3 (ref 0–0.9)
MPV: 7.6 fL (ref 0–99.8)
POC Granulocyte: 2.6 (ref 2–6.9)
POC LYMPH PERCENT: 33.8 %L (ref 10–50)
POC MID %: 6.3 %M (ref 0–12)
Platelet Count, POC: 224 10*3/uL (ref 142–424)
RBC: 4.47 M/uL — AB (ref 4.69–6.13)
RDW, POC: 12.1 %
WBC: 4.3 10*3/uL — AB (ref 4.6–10.2)

## 2016-02-18 NOTE — Patient Instructions (Addendum)
  I think that it is something you ate which is causing you to feel bad.  You can use something called Imodium (loperamide) if you continue have diarrhea.  If you start having worsening pain, fevers or chills, nausea, or not wanting to eat, go to the emergency room.    This should run it's course in the next several days.    IF you received an x-ray today, you will receive an invoice from [facility]. Please contact [facility] at [phone] with questions or concerns regarding your invoice.   IF you received labwork today, you will receive an invoice from [facility]. Please contact [facility] at [phone] with questions or concerns regarding your invoice.   Our billing staff will not be able to assist you with questions regarding bills from these companies.  You will be contacted with the lab results as soon as they are available. The fastest way to get your results is to activate your My Chart account. Instructions are located on the last page of this paperwork. If you have not heard from us regarding the results in 2 weeks, please contact this office.

## 2016-02-18 NOTE — Progress Notes (Signed)
Ralph Snow is a 30 y.o. male who presents to [facility] today for diarrhea:  1.  Diarrhea:  Patient was in his usual state of health until this past weekend when he traveled to [place]. States he usually has one bowel movement a day. Have this weekend he did not have any. Began having some fatigue on Monday and Tuesday of this week. Had a bowel movement 2 days ago. Yesterday had 5 total bowel movements which were loose stools. Also one episode of slight incontinence.  No nausea vomiting. He's been eating and drinking well. Sometimes after eating it causes him to have diarrhea.  Has some lower abdominal pain just prior to diarrhea which is resolved s/p diarrhea.  No melena or hematochezia.    Had one episode of loose stools today. Only bowel movement today. Has been drinking 3-4 bottles of water a day to keep up with fluid loss. Has had general malaise since Monday. On Monday he also is having body aches consistent with flulike symptoms. He has not had any coughing, runny nose, fevers or chills.  ROS as above.    PMH reviewed. Patient is a nonsmoker.   Past Medical History:  Diagnosis Date  . Anxiety   . Blood transfusion without reported diagnosis   . Seizures (HCC)    last seizure age 30.   History reviewed. No pertinent surgical history.  Medications reviewed. No current outpatient prescriptions on file.   No current facility-administered medications for this visit.      Physical Exam:  BP 122/60 (BP Location: Right Arm, Patient Position: Sitting, Cuff Size: Normal)   Pulse 89   Temp 98 F (36.7 C) (Oral)   Resp 18   Ht 6\' 1"  (1.854 m)   Wt 152 lb 3.2 oz (69 kg)   SpO2 98%   BMI 20.08 kg/m  Gen:  Alert, cooperative patient who appears stated age in no acute distress.  Vital signs reviewed. HEENT: EOMI,  MMM.  No pallor noted.   Pulm:  Clear to auscultation  Cardiac:  Regular rate and rhythm  Abd:  Soft/nondistended.  Very minimally tender to  palpation RLQ.  Rosving's negative, psoas negative.   Exts: Non edematous BL  LE, warm and well perfused.   Results for orders placed or performed in visit on 02/18/16  POCT CBC  Result Value Ref Range   WBC 4.3 (A) 4.6 - 10.2 K/uL   Lymph, poc 1.5 0.6 - 3.4   POC LYMPH PERCENT 33.8 10 - 50 %L   MID (cbc) 0.3 0 - 0.9   POC MID % 6.3 0 - 12 %M   POC Granulocyte 2.6 2 - 6.9   Granulocyte percent 59.9 37 - 80 %G   RBC 4.47 (A) 4.69 - 6.13 M/uL   Hemoglobin 14.2 14.1 - 18.1 g/dL   HCT, POC 69.640.2 (A) 29.543.5 - 53.7 %   MCV 90.0 80 - 97 fL   MCH, POC 31.7 (A) 27 - 31.2 pg   MCHC 35.3 31.8 - 35.4 g/dL   RDW, POC 28.412.1 %   Platelet Count, POC 224 142 - 424 K/uL   MPV 7.6 0 - 99.8 fL     Assessment and Plan:  1.  Diarrhea: - most likely secondary to change in diet and constipation over the weekend.  He has no nausea or vomiting. He is eating and drinking well. Last had something to eat breakfast this morning. He is hungry for lunch now. -  Can treat with over-the-counter loperamide. -Has only had one episode of diarrhea today.  #2. Right lower quadrant pain: -Fact the patient is a thin male noted.  States that the pain in his abdomen is 2/10 on palpation, very mild.   - Alvarado score for appendicitis is 2, only score is for RLQ pain.  Hasn't had migratory RLQ pain.  WBC is low-normal and not elevated.   - Not hungry. Has not had any pain unless I press or less he has diarrhea. -Gave him strict warning precautions about concerns for appendicitis. If he begins to have any nausea or vomiting, anorexia, worsening pain he should return her head straight to the emergency department.

## 2016-02-24 ENCOUNTER — Telehealth: Payer: Self-pay | Admitting: Family Medicine

## 2016-02-24 NOTE — Telephone Encounter (Signed)
Called to check on patient and ensure his abdominal pain had resolved, especially as he was having RLQ pain at our visit (although it was very mild.)

## 2016-05-11 ENCOUNTER — Ambulatory Visit (INDEPENDENT_AMBULATORY_CARE_PROVIDER_SITE_OTHER): Payer: BC Managed Care – PPO | Admitting: Physician Assistant

## 2016-05-11 VITALS — BP 120/74 | HR 83 | Temp 98.4°F | Resp 16 | Ht 73.0 in | Wt 157.8 lb

## 2016-05-11 DIAGNOSIS — R519 Headache, unspecified: Secondary | ICD-10-CM

## 2016-05-11 DIAGNOSIS — R110 Nausea: Secondary | ICD-10-CM

## 2016-05-11 DIAGNOSIS — R51 Headache: Secondary | ICD-10-CM | POA: Diagnosis not present

## 2016-05-11 MED ORDER — NAPROXEN 500 MG PO TABS: 500.0000 mg | ORAL_TABLET | Freq: Two times a day (BID) | ORAL | 0 refills | Status: DC

## 2016-05-11 MED ORDER — ONDANSETRON 8 MG PO TBDP: 8.0000 mg | ORAL_TABLET | Freq: Three times a day (TID) | ORAL | 0 refills | Status: DC | PRN

## 2016-05-11 MED ORDER — OMEPRAZOLE 20 MG PO CPDR: 20.0000 mg | DELAYED_RELEASE_CAPSULE | Freq: Every day | ORAL | 3 refills | Status: DC

## 2016-05-11 NOTE — Patient Instructions (Addendum)
Please take the medication as prescribed.   I would like to know if you are not feeling any better.     IF you received an x-ray today, you will receive an invoice from [facility]. Please contact [facility] at [phone] with questions or concerns regarding your invoice.   IF you received labwork today, you will receive an invoice from [facility]. Please contact [facility] at [phone] with questions or concerns regarding your invoice.   Our billing staff will not be able to assist you with questions regarding bills from these companies.  You will be contacted with the lab results as soon as they are available. The fastest way to get your results is to activate your My Chart account. Instructions are located on the last page of this paperwork. If you have not heard from us regarding the results in 2 weeks, please contact this office.

## 2016-05-11 NOTE — Progress Notes (Signed)
[facility] [address] [place] [address] [phone]  Date:  05/11/2016   Name:  Ralph Snow   DOB:  [birth year]   MRN:  [id number]  PCP:  No PCP Per Patient    History of Present Illness:  Ralph Snow is a 31 y.o. male patient who presents to [facility] for cc of intermittent headache for the last 3 days.  Dull aching on the left side of head, with throbbing eye pain.  No photophobia or vision changes.  Naps helped.   No fevers Nausea also started within the last 2-3 days as well.  Woke up with nausea, and fatigue.  Increased bowel movements with abdominal pain. 3 episodes per day.  No dysuria, hematuria, or frequency. He has had chronic GI issues that he has noticed only a direct correlation to stress. He states that he does have increased stress due to marriage issues, "personal issues", and be a better a person.   No children.   No fevers.   He denies unusual congestion, ear pain, or UR symptoms.   No sick contacts.   Rare etOH use 3 drinks per month. He has taken nothing for his symptoms. He has noticed recent change in diet which includes less meats.     Patient Active Problem List   Diagnosis Date Noted  . Seasonal allergies 02/06/2015    Past Medical History:  Diagnosis Date  . Anxiety   . Blood transfusion without reported diagnosis   . Seizures ([place])    last seizure age 40.    No past surgical history on file.  Social History  Substance Use Topics  . Smoking status: Never Smoker  . Smokeless tobacco: Never Used  . Alcohol use No    Family History  Problem Relation Age of Onset  . Anxiety disorder Father     No Known Allergies  Medication list has been reviewed and updated.  No current outpatient prescriptions on file prior to visit.   No current facility-administered medications on file prior to visit.     ROS ROS otherwise unremarkable unless listed above.   Physical Examination: BP 120/74   Pulse 83   Temp 98.4 F  (36.9 C) (Oral)   Resp 16   Ht _0  (1.854 m)   Wt 157 lb 12.8 oz (71.6 kg)   SpO2 99%   BMI 20.82 kg/m  Ideal Body Weight: Weight in (lb) to have BMI = 25: 189.1  Physical Exam  Constitutional: He is oriented to person, place, and time. He appears well-developed and well-nourished. No distress.  HENT:  Head: Normocephalic and atraumatic.  Eyes: Conjunctivae and EOM are normal. Pupils are equal, round, and reactive to light.  Cardiovascular: Normal rate.   Pulmonary/Chest: Effort normal. No respiratory distress.  Abdominal: Soft. Normal appearance. There is no hepatosplenomegaly. There is no tenderness.  Neurological: He is alert and oriented to person, place, and time.  Skin: Skin is warm and dry. He is not diaphoretic.  Psychiatric: He has a normal mood and affect. His behavior is normal.     Assessment and Plan: Ralph Snow is a 31 y.o. male who is here today for cc of headache and abdominal pain. Possibly viral, secondary to diet change or stress,  Advised to have heavy hydration and full meals.   Given zofran for short term symptoms.  Will treat head pain with naproxen, and accompany medicine with omeprazole.  Follow up as needed.  Will  follow up with pending lab results.  Acute nonintractable headache, unspecified headache type - Plan: CBC, CMP14+EGFR, ondansetron (ZOFRAN-ODT) 8 MG disintegrating tablet, naproxen (NAPROSYN) 500 MG tablet, omeprazole (PRILOSEC) 20 MG capsule  Nausea without vomiting - Plan: ondansetron (ZOFRAN-ODT) 8 MG disintegrating tablet, omeprazole (PRILOSEC) 20 MG capsule  Ivar Drape, PA-C [facility] 1/3/20189:23 PM

## 2016-05-12 LAB — CMP14+EGFR
ALT: 17 IU/L (ref 0–44)
AST: 17 IU/L (ref 0–40)
Albumin/Globulin Ratio: 1.7 (ref 1.2–2.2)
Albumin: 4.5 g/dL (ref 3.5–5.5)
Alkaline Phosphatase: 58 IU/L (ref 39–117)
BUN/Creatinine Ratio: 20 (ref 9–20)
BUN: 18 mg/dL (ref 6–20)
Bilirubin Total: 0.7 mg/dL (ref 0.0–1.2)
CO2: 24 mmol/L (ref 18–29)
Calcium: 9.7 mg/dL (ref 8.7–10.2)
Chloride: 101 mmol/L (ref 96–106)
Creatinine, Ser: 0.91 mg/dL (ref 0.76–1.27)
GFR calc Af Amer: 132 mL/min/{1.73_m2} (ref >59)
GFR calc non Af Amer: 114 mL/min/{1.73_m2} (ref >59)
Globulin, Total: 2.6 g/dL (ref 1.5–4.5)
Glucose: 85 mg/dL (ref 65–99)
Potassium: 4.4 mmol/L (ref 3.5–5.2)
Sodium: 138 mmol/L (ref 134–144)
Total Protein: 7.1 g/dL (ref 6.0–8.5)

## 2016-05-12 LAB — CBC
Hematocrit: 43.2 % (ref 37.5–51.0)
Hemoglobin: 14.4 g/dL (ref 13.0–17.7)
MCH: 30.4 pg (ref 26.6–33.0)
MCHC: 33.3 g/dL (ref 31.5–35.7)
MCV: 91 fL (ref 79–97)
Platelets: 281 10*3/uL (ref 150–379)
RBC: 4.74 x10E6/uL (ref 4.14–5.80)
RDW: 11.6 % — ABNORMAL LOW (ref 12.3–15.4)
WBC: 5.1 10*3/uL (ref 3.4–10.8)

## 2016-10-27 ENCOUNTER — Ambulatory Visit (INDEPENDENT_AMBULATORY_CARE_PROVIDER_SITE_OTHER): Payer: BC Managed Care – PPO | Admitting: Physician Assistant

## 2016-10-27 ENCOUNTER — Encounter: Payer: Self-pay | Admitting: Physician Assistant

## 2016-10-27 VITALS — BP 110/74 | HR 83 | Temp 98.0°F | Resp 16 | Ht 72.8 in | Wt 158.8 lb

## 2016-10-27 DIAGNOSIS — Z130 Encounter for screening for diseases of the blood and blood-forming organs and certain disorders involving the immune mechanism: Secondary | ICD-10-CM

## 2016-10-27 DIAGNOSIS — Z1329 Encounter for screening for other suspected endocrine disorder: Secondary | ICD-10-CM

## 2016-10-27 DIAGNOSIS — Z0000 Encounter for general adult medical examination without abnormal findings: Secondary | ICD-10-CM | POA: Diagnosis not present

## 2016-10-27 DIAGNOSIS — Z13220 Encounter for screening for lipoid disorders: Secondary | ICD-10-CM | POA: Diagnosis not present

## 2016-10-27 NOTE — Progress Notes (Signed)
PRIMARY CARE AT [facility] [address] [place] [address] [phone]  Date:  10/27/2016   Name:  Ralph Snow   DOB:  [birth year]   MRN:  [id number]  PCP:  Garnetta Buddy, PA    History of Present Illness:  Ralph Snow is a 31 y.o. male patient who presents to PCP with  Chief Complaint  Patient presents with  . Annual Exam    CPE     DIET: eating fried foods, currently.  No restrictions at this time.  May take occasional protein powder.  Not typical soda.  Water intake is about 32-48  BM: normal.  No black or bloody stool.  No constipation or diarrhea  URINATION: normal.  No dysuria, hematuria, frequency  SLEEP: not good. 11-8am.  Night projects.    SOCIAL ACTIVITY: play video games, movies, hang out with wife Exercise: 3x per week.  Started 1 month ago.  EtOH: 1-3 beer per month Tobacco or vaping: none Substance : none.  Paternal Uncle: 2 years with strokes, 49, with elevated blood pressure.  Father and paternal cousin: anxiety.  Mother: thyroid disease   Patient Active Problem List   Diagnosis Date Noted  . Seasonal allergies 02/06/2015    Past Medical History:  Diagnosis Date  . Allergy    seasonal  . Anxiety   . Blood transfusion without reported diagnosis   . Depression   . Seizures (HCC)    last seizure age 62.    History reviewed. No pertinent surgical history.  Social History  Substance Use Topics  . Smoking status: Never Smoker  . Smokeless tobacco: Never Used  . Alcohol use 0.0 oz/week     Comment: occasional    Family History  Problem Relation Age of Onset  . Anxiety disorder Father     No Known Allergies  Medication list has been reviewed and updated.  Current Outpatient Prescriptions on File Prior to Visit  Medication Sig Dispense Refill  . naproxen (NAPROSYN) 500 MG tablet Take 1 tablet (500 mg total) by mouth 2 (two) times daily with a meal. (Patient not taking: Reported on 10/27/2016) 30 tablet 0  . omeprazole  (PRILOSEC) 20 MG capsule Take 1 capsule (20 mg total) by mouth daily. (Patient not taking: Reported on 10/27/2016) 30 capsule 3  . ondansetron (ZOFRAN-ODT) 8 MG disintegrating tablet Take 1 tablet (8 mg total) by mouth every 8 (eight) hours as needed for nausea. (Patient not taking: Reported on 10/27/2016) 30 tablet 0   No current facility-administered medications on file prior to visit.     Review of Systems  Constitutional: Negative for chills and fever.  HENT: Negative for ear discharge, ear pain and sore throat.   Eyes: Negative for blurred vision and double vision.  Respiratory: Negative for cough, shortness of breath and wheezing.   Cardiovascular: Negative for chest pain, palpitations and leg swelling.  Gastrointestinal: Negative for diarrhea, nausea and vomiting.  Genitourinary: Negative for dysuria, frequency and hematuria.  Skin: Negative for itching and rash.  Neurological: Negative for dizziness and headaches.  Psychiatric/Behavioral: Positive for depression (sad mood, is in cbt at this time). Negative for suicidal ideas. The patient does not have insomnia.    ROS otherwise unremarkable unless listed above.  Physical Examination: BP 110/74 (BP Location: Right Arm, Patient Position: Sitting, Cuff Size: Normal)   Pulse 83   Temp 98 F (36.7 C) (Oral)   Resp 16   Ht 6' 0.8" (1.849 m)   Wt 158  lb 12.8 oz (72 kg)   SpO2 97%   BMI 21.07 kg/m  Ideal Body Weight: Weight in (lb) to have BMI = 25: 188.1  Physical Exam  Constitutional: He is oriented to person, place, and time. He appears well-developed and well-nourished. No distress.  HENT:  Head: Normocephalic and atraumatic.  Right Ear: Tympanic membrane, external ear and ear canal normal.  Left Ear: Tympanic membrane, external ear and ear canal normal.  Eyes: Conjunctivae and EOM are normal. Pupils are equal, round, and reactive to light.  Cardiovascular: Normal rate and regular rhythm.  Exam reveals no friction rub.   No  murmur heard. Pulmonary/Chest: Effort normal. No respiratory distress. He has no wheezes.  Abdominal: Soft. Bowel sounds are normal. He exhibits no distension and no mass. There is no tenderness.  Musculoskeletal: Normal range of motion. He exhibits no edema or tenderness.  Neurological: He is alert and oriented to person, place, and time. He displays normal reflexes.  Skin: Skin is warm and dry. He is not diaphoretic.  Psychiatric: He has a normal mood and affect. His behavior is normal.     Assessment and Plan: Ralph Snow is a 31 y.o. male who is here today for annual physical exam. Annual physical exam - Plan: TSH, CBC, Lipid panel  Screening for thyroid disorder - Plan: TSH  Screening for lipid disorders - Plan: Lipid panel  Screening for deficiency anemia - Plan: CBC  Trena PlattStephanie Lisaanne Lawrie, PA-C [facility] 6/22/20189:49 PM

## 2016-10-27 NOTE — Patient Instructions (Addendum)

## 2016-10-28 LAB — LIPID PANEL
Chol/HDL Ratio: 4 ratio (ref 0.0–5.0)
Cholesterol, Total: 200 mg/dL — ABNORMAL HIGH (ref 100–199)
HDL: 50 mg/dL (ref >39)
LDL Calculated: 140 mg/dL — ABNORMAL HIGH (ref 0–99)
Triglycerides: 48 mg/dL (ref 0–149)
VLDL Cholesterol Cal: 10 mg/dL (ref 5–40)

## 2016-10-28 LAB — CBC
Hematocrit: 45.5 % (ref 37.5–51.0)
Hemoglobin: 14.8 g/dL (ref 13.0–17.7)
MCH: 30.4 pg (ref 26.6–33.0)
MCHC: 32.5 g/dL (ref 31.5–35.7)
MCV: 93 fL (ref 79–97)
Platelets: 260 10*3/uL (ref 150–379)
RBC: 4.87 x10E6/uL (ref 4.14–5.80)
RDW: 12.1 % — ABNORMAL LOW (ref 12.3–15.4)
WBC: 3.9 10*3/uL (ref 3.4–10.8)

## 2016-10-28 LAB — TSH: TSH: 0.941 u[IU]/mL (ref 0.450–4.500)

## 2017-01-05 ENCOUNTER — Ambulatory Visit: Payer: BC Managed Care – PPO | Admitting: Physician Assistant

## 2017-04-18 DIAGNOSIS — F329 Major depressive disorder, single episode, unspecified: Secondary | ICD-10-CM | POA: Insufficient documentation

## 2017-05-11 ENCOUNTER — Other Ambulatory Visit: Payer: Self-pay

## 2017-05-11 ENCOUNTER — Ambulatory Visit: Payer: BC Managed Care – PPO | Admitting: Physician Assistant

## 2017-05-11 DIAGNOSIS — R1030 Lower abdominal pain, unspecified: Secondary | ICD-10-CM | POA: Diagnosis not present

## 2017-05-11 DIAGNOSIS — N50819 Testicular pain, unspecified: Secondary | ICD-10-CM

## 2017-05-11 LAB — POCT URINALYSIS DIP (MANUAL ENTRY)
Bilirubin, UA: NEGATIVE
Blood, UA: NEGATIVE
Glucose, UA: NEGATIVE mg/dL
Ketones, POC UA: NEGATIVE mg/dL
Leukocytes, UA: NEGATIVE
Nitrite, UA: NEGATIVE
Protein Ur, POC: NEGATIVE mg/dL
Spec Grav, UA: >=1.03 — AB (ref 1.010–1.025)
Urobilinogen, UA: 1 E.U./dL
pH, UA: 6 (ref 5.0–8.0)

## 2017-05-11 MED ORDER — IBUPROFEN 600 MG PO TABS: 600.0000 mg | ORAL_TABLET | Freq: Three times a day (TID) | ORAL | 0 refills | Status: DC | PRN

## 2017-05-11 NOTE — Progress Notes (Signed)
PRIMARY CARE AT [facility] [address], [place] [address] [phone]  Date:  05/11/2017   Name:  Ralph Snow   DOB:  Mar 19, 1986   MRN:  [id number]  PCP:  Garnetta Buddy, PA    History of Present Illness:  Ralph Snow is a 32 y.o. male patient who presents to PCP with  Chief Complaint  Patient presents with  . Testicle Pain    right side pain / x yesterday.pt states he has fissure that may have opened out in anus      2 days of right sided testicular pain No urinary frequency, hematuria, or dysuria. No fever No heavy lifting or constipation at this time. No penile discharge One partner, wife   Patient Active Problem List   Diagnosis Date Noted  . Seasonal allergies 02/06/2015    Past Medical History:  Diagnosis Date  . Allergy    seasonal  . Anxiety   . Blood transfusion without reported diagnosis   . Depression   . Seizures (HCC)    last seizure age 69.    No past surgical history on file.  Social History   Tobacco Use  . Smoking status: Never Smoker  . Smokeless tobacco: Never Used  Substance Use Topics  . Alcohol use: Yes    Alcohol/week: 0.0 oz    Comment: occasional  . Drug use: No    Family History  Problem Relation Age of Onset  . Anxiety disorder Father     No Known Allergies  Medication list has been reviewed and updated.  Current Outpatient Medications on File Prior to Visit  Medication Sig Dispense Refill  . sertraline (ZOLOFT) 50 MG tablet Take 50 mg by mouth daily.    . naproxen (NAPROSYN) 500 MG tablet Take 1 tablet (500 mg total) by mouth 2 (two) times daily with a meal. (Patient not taking: Reported on 10/27/2016) 30 tablet 0  . omeprazole (PRILOSEC) 20 MG capsule Take 1 capsule (20 mg total) by mouth daily. (Patient not taking: Reported on 10/27/2016) 30 capsule 3  . ondansetron (ZOFRAN-ODT) 8 MG disintegrating tablet Take 1 tablet (8 mg total) by mouth every 8 (eight) hours as needed for nausea. (Patient not taking:  Reported on 10/27/2016) 30 tablet 0   No current facility-administered medications on file prior to visit.     ROS ROS otherwise unremarkable unless listed above.  Physical Examination: There were no vitals taken for this visit. Ideal Body Weight:    Physical Exam  Constitutional: He is oriented to person, place, and time. He appears well-developed and well-nourished. No distress.  HENT:  Head: Normocephalic and atraumatic.  Eyes: Conjunctivae and EOM are normal. Pupils are equal, round, and reactive to light.  Cardiovascular: Normal rate.  Pulmonary/Chest: Effort normal. No respiratory distress.  Abdominal: Hernia confirmed negative in the right inguinal area.  Genitourinary: Cremasteric reflex is present. Right testis shows no mass, no swelling and no tenderness. Right testis is descended.  Neurological: He is alert and oriented to person, place, and time.  Skin: Skin is warm and dry. He is not diaphoretic.  Psychiatric: He has a normal mood and affect. His behavior is normal.     Assessment and Plan: Ralph Snow is a 32 y.o. male who is here today for cc of  Chief Complaint  Patient presents with  . Testicle Pain    right side pain / x yesterday.pt states he has fissure that may have opened out in anus  Lower abdominal pain - Plan: POCT urinalysis dipstick, ibuprofen (ADVIL,MOTRIN) 600 MG tablet  Testicular pain - Plan: GC/Chlamydia Probe Amp, US Scrotum, ibuprofen (ADVIL,MOTRIN) 600 MG tablet  Trena PlattStephanie English, PA-C [facility] 1/14/20199:54 PM

## 2017-05-11 NOTE — Patient Instructions (Addendum)
I am obtaining a testicular ultrasound.  Please await contact for this.  In the meantime, I would like you to take the nsaid  Follow these instructions at home:  Take medicines only as directed by your health care provider.  Wear supportive underwear.  Use an athletic supporter for sports.  Keep all follow-up visits as directed by your health care provider. This is important. Contact a health care provider if:  Your pain is increasing.  You have redness in the affected area.  You have swelling that does not decrease when you are lying down.  One of your testicles is smaller than the other.  Your testicle becomes enlarged, swollen, or painful.   IF you received an x-ray today, you will receive an invoice from [facility]. Please contact [facility] at [phone] with questions or concerns regarding your invoice.   IF you received labwork today, you will receive an invoice from [facility]. Please contact [facility] at [phone] with questions or concerns regarding your invoice.   Our billing staff will not be able to assist you with questions regarding bills from these companies.  You will be contacted with the lab results as soon as they are available. The fastest way to get your results is to activate your My Chart account. Instructions are located on the last page of this paperwork. If you have not heard from us regarding the results in 2 weeks, please contact this office.

## 2017-05-13 LAB — GC/CHLAMYDIA PROBE AMP
Chlamydia trachomatis, NAA: NEGATIVE
Neisseria gonorrhoeae by PCR: NEGATIVE

## 2017-05-22 ENCOUNTER — Telehealth: Payer: Self-pay | Admitting: Physician Assistant

## 2017-05-22 ENCOUNTER — Encounter: Payer: Self-pay | Admitting: Physician Assistant

## 2017-05-22 DIAGNOSIS — N50811 Right testicular pain: Secondary | ICD-10-CM

## 2017-05-22 NOTE — Telephone Encounter (Signed)
Did we do this ultrasound of the scrotum?

## 2017-05-23 ENCOUNTER — Other Ambulatory Visit: Payer: Self-pay | Admitting: Physician Assistant

## 2017-05-23 NOTE — Telephone Encounter (Signed)
I placed this now.  Thank you

## 2017-05-23 NOTE — Telephone Encounter (Signed)
Called [facility] and they stated that the order needs to be changed to img [id number] to add with doppler.. Then they will schedule.  thanks

## 2017-06-06 NOTE — Telephone Encounter (Signed)
-----   Message from Laqueta LindenSara A Seabolt sent at 06/05/2017  8:06 AM EST ----- Ivin PootStephanie,  [facility] tried contacting the pt on 05/30/17 to schedule both ultrasounds. It looks like they had to leave a message for the pt to call back.  Thanks,  Pattricia BossAnnie  ----- Message ----- From: Garnetta BuddyEnglish, Stephanie D, PA Sent: 06/04/2017   9:27 PM To: Pcp Referral Pool  Negative gonorrhea and chlamydia.   Whatever happened to his imaging

## 2017-06-06 NOTE — Telephone Encounter (Signed)
Please contact patient and inquire if he still has the testicular pain.  Please advise to schedule with [facility] if he wishes to proceed.

## 2017-06-06 NOTE — Telephone Encounter (Signed)
lmvm to call us back and CRM entered 

## 2017-06-07 NOTE — Telephone Encounter (Signed)
Pt states he is no longer having testicular pain. States he will call back if reoccurs.

## 2017-06-08 NOTE — Telephone Encounter (Signed)
[name]

## 2017-08-09 ENCOUNTER — Encounter: Payer: Self-pay | Admitting: Physician Assistant

## 2017-12-13 ENCOUNTER — Encounter: Payer: Self-pay | Admitting: Family Medicine

## 2017-12-13 ENCOUNTER — Other Ambulatory Visit: Payer: Self-pay

## 2017-12-13 ENCOUNTER — Ambulatory Visit (INDEPENDENT_AMBULATORY_CARE_PROVIDER_SITE_OTHER): Payer: BC Managed Care – PPO | Admitting: Family Medicine

## 2017-12-13 VITALS — BP 123/70 | HR 84 | Temp 98.7°F | Ht 72.5 in | Wt 167.0 lb

## 2017-12-13 DIAGNOSIS — Z0000 Encounter for general adult medical examination without abnormal findings: Secondary | ICD-10-CM | POA: Diagnosis not present

## 2017-12-13 DIAGNOSIS — R0683 Snoring: Secondary | ICD-10-CM

## 2017-12-13 DIAGNOSIS — R5383 Other fatigue: Secondary | ICD-10-CM | POA: Diagnosis not present

## 2017-12-13 DIAGNOSIS — R51 Headache: Secondary | ICD-10-CM | POA: Diagnosis not present

## 2017-12-13 DIAGNOSIS — R519 Headache, unspecified: Secondary | ICD-10-CM

## 2017-12-13 NOTE — Progress Notes (Signed)
8/7/201911:38 AM  Ralph Snow [birth year], 32 y.o. male [id number]  Chief Complaint  Patient presents with  . Annual Exam    feeling very fatigue and having migraines for the past week.     HPI:   Patient is a 32 y.o. male with past medical history significant for depression and anxieyt who presents today for CPE  Last CPE 2018 Depression and anxiety well controlled, managed by Rhetta Mura at [facility] regularly, avg diet Married Employed in Architect Rare etoh, no tob or illicit drugs  Having headaches and fatigue for the  Past week Had to miss work las Friday due to feeling tired On Saturday woke up with headache, lingered since then, most intense when he wakes up Feels it across forehead/temples, throbs some in the morning but mostly tight band feeling Denies URI sx Denies any fever, chills, vision changes, neck stiffness Has been pushing water these past 3 days Had some nausea on Friday but none since No vomiting or diarrhea. No GU complaints No sick contacts Glasses are new, sees fine with them No h/o headaches + nightsweats but had too much clothes on No swollen glands No new sex partners + snoring, while ago his wife mentioned she he has stopped breathing Using mouth guard and this has helped him not snore, has not been using of recent Is he wanted to he would easily fall asleep during the day Not sure if he wakes up feeling rested.   Fall Risk  12/13/2017 05/11/2017 05/11/2017 10/27/2016 05/11/2016  Falls in the past year? No No No No No     Depression screen [facility] 2/9 12/13/2017 12/13/2017 05/11/2017  Decreased Interest 1 0 0  Down, Depressed, Hopeless 1 0 0  PHQ - 2 Score 2 0 0  Altered sleeping 2 - -  Tired, decreased energy 2 - -  Change in appetite 1 - -  Feeling bad or failure about yourself  0 - -  Trouble concentrating 1 - -  Moving slowly or fidgety/restless 0 - -  Suicidal thoughts 0 - -  PHQ-9 Score 8 - -  Difficult doing work/chores - - -     No Known Allergies  Prior to Admission medications   Medication Sig Start Date End Date Taking? Authorizing Provider  ibuprofen (ADVIL,MOTRIN) 600 MG tablet Take 1 tablet (600 mg total) by mouth every 8 (eight) hours as needed. 05/11/17   Ivar Drape D, PA  sertraline (ZOLOFT) 50 MG tablet Take 50 mg by mouth daily.    [provider]    Past Medical History:  Diagnosis Date  . Allergy    seasonal  . Anxiety   . Blood transfusion without reported diagnosis   . Depression   . Seizures ([place])    last seizure age 24.    History reviewed. No pertinent surgical history.  Social History   Tobacco Use  . Smoking status: Never Smoker  . Smokeless tobacco: Never Used  Substance Use Topics  . Alcohol use: Yes    Alcohol/week: 0.0 oz    Comment: occasional    Family History  Problem Relation Age of Onset  . Anxiety disorder Father     ROS Per hpi  OBJECTIVE:  Blood pressure 123/70, pulse 84, temperature 98.7 F (37.1 C), temperature source Oral, height 6' 0.5" (1.842 m), weight 167 lb (75.8 kg), SpO2 97 %. Body mass index is 22.34 kg/m.   Wt Readings from Last 3 Encounters:  12/13/17 167 lb (75.8 kg)  10/27/16 158 lb 12.8 oz (72 kg)  05/11/16 157 lb 12.8 oz (71.6 kg)    Physical Exam  Constitutional: He is oriented to person, place, and time. He appears well-developed and well-nourished.  HENT:  Head: Normocephalic and atraumatic.  Right Ear: Hearing, tympanic membrane, external ear and ear canal normal.  Left Ear: Hearing, tympanic membrane, external ear and ear canal normal.  Mouth/Throat: Oropharynx is clear and moist. No oropharyngeal exudate.  Eyes: Pupils are equal, round, and reactive to light. Conjunctivae and EOM are normal.  Neck: Neck supple. No thyromegaly present.  Cardiovascular: Normal rate, regular rhythm, normal heart sounds and intact distal pulses. Exam reveals no gallop and no friction rub.  No murmur heard. Pulmonary/Chest:  Effort normal and breath sounds normal. He has no wheezes. He has no rhonchi. He has no rales.  Abdominal: Soft. Bowel sounds are normal. He exhibits no distension and no mass. There is no tenderness.  Musculoskeletal: Normal range of motion. He exhibits no edema.  Lymphadenopathy:    He has no cervical adenopathy.  Neurological: He is alert and oriented to person, place, and time. He has normal strength and normal reflexes. No cranial nerve deficit. Coordination and gait normal.  Skin: Skin is warm and dry.  Psychiatric: He has a normal mood and affect.  Nursing note and vitals reviewed.   ASSESSMENT and PLAN  1. Annual physical exam Routine HCM labs ordered. HCM reviewed/discussed. Anticipatory guidance regarding healthy weight, lifestyle and choices given.   2. Fatigue, unspecified type - CMP14+EGFR - TSH - CBC with Differential/Platelet  3. Acute nonintractable headache, unspecified headache type See #4, push fluids. Ibuprofen prn  4. Snoring Discussed starting nightly use of mouth guard. Are fatigue and headaches 2/2 mild OSA being treated by mouth guard? Will re-eval at next visit. Consider sleep study  5. Need for vaccination Declines Td  Return in about 2 weeks (around 12/27/2017).    Rutherford Guys, MD Primary Care at [facility] [place], [place] [address] Ph.  [phone] Fax [phone]

## 2017-12-13 NOTE — Patient Instructions (Addendum)
Start wearing mouth guard again every night Keep pushing fluids and exercising   IF you received an x-ray today, you will receive an invoice from [facility]. Please contact [facility] at [phone] with questions or concerns regarding your invoice.   IF you received labwork today, you will receive an invoice from [facility]. Please contact [facility] at [phone] with questions or concerns regarding your invoice.   Our billing staff will not be able to assist you with questions regarding bills from these companies.  You will be contacted with the lab results as soon as they are available. The fastest way to get your results is to activate your My Chart account. Instructions are located on the last page of this paperwork. If you have not heard from [place] regarding the results in 2 weeks, please contact this office.     Preventive Care 18-39 Years, Male Preventive care refers to lifestyle choices and visits with your health care provider that can promote health and wellness. What does preventive care include?  A yearly physical exam. This is also called an annual well check.  Dental exams once or twice a year.  Routine eye exams. Ask your health care provider how often you should have your eyes checked.  Personal lifestyle choices, including: ? Daily care of your teeth and gums. ? Regular physical activity. ? Eating a healthy diet. ? Avoiding tobacco and drug use. ? Limiting alcohol use. ? Practicing safe sex. What happens during an annual well check? The services and screenings done by your health care provider during your annual well check will depend on your age, overall health, lifestyle risk factors, and family history of disease. Counseling Your health care provider may ask you questions about your:  Alcohol use.  Tobacco use.  Drug use.  Emotional well-being.  Home and relationship well-being.  Sexual activity.  Eating habits.  Work and work  Statistician.  Screening You may have the following tests or measurements:  Height, weight, and BMI.  Blood pressure.  Lipid and cholesterol levels. These may be checked every 5 years starting at age 61.  Diabetes screening. This is done by checking your blood sugar (glucose) after you have not eaten for a while (fasting).  Skin check.  Hepatitis C blood test.  Hepatitis B blood test.  Sexually transmitted disease (STD) testing.  Discuss your test results, treatment options, and if necessary, the need for more tests with your health care provider. Vaccines Your health care provider may recommend certain vaccines, such as:  Influenza vaccine. This is recommended every year.  Tetanus, diphtheria, and acellular pertussis (Tdap, Td) vaccine. You may need a Td booster every 10 years.  Varicella vaccine. You may need this if you have not been vaccinated.  HPV vaccine. If you are 31 or younger, you may need three doses over 6 months.  Measles, mumps, and rubella (MMR) vaccine. You may need at least one dose of MMR.You may also need a second dose.  Pneumococcal 13-valent conjugate (PCV13) vaccine. You may need this if you have certain conditions and have not been vaccinated.  Pneumococcal polysaccharide (PPSV23) vaccine. You may need one or two doses if you smoke cigarettes or if you have certain conditions.  Meningococcal vaccine. One dose is recommended if you are age 74-21 years and a first-year college student living in a residence hall, or if you have one of several medical conditions. You may also need additional booster doses.  Hepatitis A vaccine. You may need this if you  have certain conditions or if you travel or work in places where you may be exposed to hepatitis A.  Hepatitis B vaccine. You may need this if you have certain conditions or if you travel or work in places where you may be exposed to hepatitis B.  Haemophilus influenzae type b (Hib) vaccine. You may need  this if you have certain risk factors.  Talk to your health care provider about which screenings and vaccines you need and how often you need them. This information is not intended to replace advice given to you by your health care provider. Make sure you discuss any questions you have with your health care provider. Document Released: 06/21/2001 Document Revised: 01/13/2016 Document Reviewed: 02/24/2015 Elsevier Interactive Patient Education  [employer].

## 2017-12-14 LAB — CBC WITH DIFFERENTIAL/PLATELET
Basophils Absolute: 0 10*3/uL (ref 0.0–0.2)
Basos: 0 %
EOS (ABSOLUTE): 0.1 10*3/uL (ref 0.0–0.4)
Eos: 3 %
Hematocrit: 43.2 % (ref 37.5–51.0)
Hemoglobin: 14.1 g/dL (ref 13.0–17.7)
Immature Grans (Abs): 0 10*3/uL (ref 0.0–0.1)
Immature Granulocytes: 1 %
Lymphocytes Absolute: 1.5 10*3/uL (ref 0.7–3.1)
Lymphs: 33 %
MCH: 30.4 pg (ref 26.6–33.0)
MCHC: 32.6 g/dL (ref 31.5–35.7)
MCV: 93 fL (ref 79–97)
Monocytes Absolute: 0.5 10*3/uL (ref 0.1–0.9)
Monocytes: 10 %
Neutrophils Absolute: 2.5 10*3/uL (ref 1.4–7.0)
Neutrophils: 53 %
Platelets: 249 10*3/uL (ref 150–450)
RBC: 4.64 x10E6/uL (ref 4.14–5.80)
RDW: 12.2 % — ABNORMAL LOW (ref 12.3–15.4)
WBC: 4.7 10*3/uL (ref 3.4–10.8)

## 2017-12-14 LAB — CMP14+EGFR
ALT: 18 IU/L (ref 0–44)
AST: 22 IU/L (ref 0–40)
Albumin/Globulin Ratio: 1.9 (ref 1.2–2.2)
Albumin: 4.7 g/dL (ref 3.5–5.5)
Alkaline Phosphatase: 62 IU/L (ref 39–117)
BUN/Creatinine Ratio: 14 (ref 9–20)
BUN: 13 mg/dL (ref 6–20)
Bilirubin Total: 0.3 mg/dL (ref 0.0–1.2)
CO2: 24 mmol/L (ref 20–29)
Calcium: 9.5 mg/dL (ref 8.7–10.2)
Chloride: 101 mmol/L (ref 96–106)
Creatinine, Ser: 0.92 mg/dL (ref 0.76–1.27)
GFR calc Af Amer: 129 mL/min/{1.73_m2} (ref >59)
GFR calc non Af Amer: 112 mL/min/{1.73_m2} (ref >59)
Globulin, Total: 2.5 g/dL (ref 1.5–4.5)
Glucose: 81 mg/dL (ref 65–99)
Potassium: 4.4 mmol/L (ref 3.5–5.2)
Sodium: 137 mmol/L (ref 134–144)
Total Protein: 7.2 g/dL (ref 6.0–8.5)

## 2017-12-14 LAB — TSH: TSH: 1.08 u[IU]/mL (ref 0.450–4.500)

## 2017-12-27 ENCOUNTER — Encounter: Payer: Self-pay | Admitting: Family Medicine

## 2017-12-27 ENCOUNTER — Other Ambulatory Visit: Payer: Self-pay

## 2017-12-27 ENCOUNTER — Ambulatory Visit: Payer: BC Managed Care – PPO | Admitting: Family Medicine

## 2017-12-27 VITALS — BP 110/60 | HR 92 | Temp 98.5°F | Resp 16 | Ht 72.5 in | Wt 169.4 lb

## 2017-12-27 DIAGNOSIS — R51 Headache: Secondary | ICD-10-CM | POA: Diagnosis not present

## 2017-12-27 DIAGNOSIS — R5383 Other fatigue: Secondary | ICD-10-CM

## 2017-12-27 DIAGNOSIS — R519 Headache, unspecified: Secondary | ICD-10-CM

## 2017-12-27 DIAGNOSIS — Z23 Encounter for immunization: Secondary | ICD-10-CM | POA: Diagnosis not present

## 2017-12-27 DIAGNOSIS — Z7189 Other specified counseling: Secondary | ICD-10-CM

## 2017-12-27 NOTE — Patient Instructions (Signed)

## 2017-12-27 NOTE — Progress Notes (Signed)
8/21/20191:36 PM  Ralph Snow August 03, 1985, 32 y.o. male [id number]  Chief Complaint  Patient presents with  . 2 Week Follow-up    headaches and fatigue     HPI:   Patient is a 32 y.o. male who presents today for followup up on fatigue and headaches  He has been doing much better since our last visit Has been going to the gym a whole lot more Tracking calories/nurtients Fatigue and headaches mostly resolved Was using mouthguard nightly except for last 2 nights due to fitting problems, had a slight headache this morning Trying to gain muscle weight  Also has questions regarding iron supplements, per tracker he needs to consume more iron He is not a vegeterian Last cbc about 2 months ago - no anemia  Requesting Td vaccine today, declined at CPE  Fall Risk  12/27/2017 12/13/2017 05/11/2017 05/11/2017 10/27/2016  Falls in the past year? No No No No No     Depression screen [facility] 2/9 12/27/2017 12/13/2017 12/13/2017  Decreased Interest 0 1 0  Down, Depressed, Hopeless 0 1 0  PHQ - 2 Score 0 2 0  Altered sleeping - 2 -  Tired, decreased energy - 2 -  Change in appetite - 1 -  Feeling bad or failure about yourself  - 0 -  Trouble concentrating - 1 -  Moving slowly or fidgety/restless - 0 -  Suicidal thoughts - 0 -  PHQ-9 Score - 8 -  Difficult doing work/chores - - -    No Known Allergies  Prior to Admission medications   Medication Sig Start Date End Date Taking? Authorizing Provider  ibuprofen (ADVIL,MOTRIN) 600 MG tablet Take 1 tablet (600 mg total) by mouth every 8 (eight) hours as needed. 05/11/17  Yes English, Judeth CornfieldStephanie D, PA  sertraline (ZOLOFT) 50 MG tablet Take 50 mg by mouth daily.   Yes [provider]    Past Medical History:  Diagnosis Date  . Allergy    seasonal  . Anxiety   . Blood transfusion without reported diagnosis   . Depression   . Seizures ([facility])    last seizure age 32.    History reviewed. No pertinent surgical history.  Social History    Tobacco Use  . Smoking status: Never Smoker  . Smokeless tobacco: Never Used  Substance Use Topics  . Alcohol use: Yes    Alcohol/week: 0.0 standard drinks    Comment: occasional    Family History  Problem Relation Age of Onset  . Anxiety disorder Father     ROS Per hpi  OBJECTIVE:  Blood pressure 110/60, pulse 92, temperature 98.5 F (36.9 C), resp. rate 16, height 6' 0.5" (1.842 m), weight 169 lb 6.4 oz (76.8 kg), SpO2 96 %. Body mass index is 22.66 kg/m.   Wt Readings from Last 3 Encounters:  12/27/17 169 lb 6.4 oz (76.8 kg)  12/13/17 167 lb (75.8 kg)  10/27/16 158 lb 12.8 oz (72 kg)    Physical Exam  Constitutional: He is oriented to person, place, and time. He appears well-developed and well-nourished.  HENT:  Head: Normocephalic and atraumatic.  Mouth/Throat: Oropharynx is clear and moist.  Eyes: Pupils are equal, round, and reactive to light. EOM are normal.  Neck: Neck supple.  Pulmonary/Chest: Effort normal.  Neurological: He is alert and oriented to person, place, and time.  Skin: Skin is warm and dry.  Psychiatric: He has a normal mood and affect.  Nursing note and vitals reviewed.   ASSESSMENT  and PLAN  1. Fatigue, unspecified type 2. Nonintractable episodic headache, unspecified headache type Resolved. Continue drinking sufficient water, using mouth guard, healthy lifestyle  3. Need for prophylactic vaccination with tetanus toxoid alone Td given today  4. Counseling on health promotion and disease prevention Discussed importance of healthy diet, regular exercise and healthy weight.   Other orders - Td vaccine greater than or equal to 7yo preservative free IM  Return in about 1 year (around 12/28/2018).    Myles LippsIrma M Santiago, MD Primary Care at [facility] [address] [place], [place] [address] Ph.  [phone] Fax [phone]

## 2018-02-16 ENCOUNTER — Ambulatory Visit: Payer: BC Managed Care – PPO | Admitting: Family Medicine

## 2018-02-16 NOTE — Progress Notes (Deleted)
  No chief complaint on file.   HPI  4 review of systems  Past Medical History:  Diagnosis Date  . Allergy    seasonal  . Anxiety   . Blood transfusion without reported diagnosis   . Depression   . Seizures ([facility])    last seizure age 32.    Current Outpatient Medications  Medication Sig Dispense Refill  . ibuprofen (ADVIL,MOTRIN) 600 MG tablet Take 1 tablet (600 mg total) by mouth every 8 (eight) hours as needed. 30 tablet 0  . sertraline (ZOLOFT) 50 MG tablet Take 50 mg by mouth daily.     No current facility-administered medications for this visit.     Allergies: No Known Allergies  No past surgical history on file.  Social History   Socioeconomic History  . Marital status: Married    Spouse name: Demitrus Francisco  . Number of children: 0  . Years of education: College  . Highest education level: Not on file  Occupational History  . Occupation: Therapist, sports: [employer]    Comment: [employer]  Social Needs  . Financial resource strain: Not on file  . Food insecurity:    Worry: Not on file    Inability: Not on file  . Transportation needs:    Medical: Not on file    Non-medical: Not on file  Tobacco Use  . Smoking status: Never Smoker  . Smokeless tobacco: Never Used  Substance and Sexual Activity  . Alcohol use: Yes    Alcohol/week: 0.0 standard drinks    Comment: occasional  . Drug use: No  . Sexual activity: Yes    Partners: Female  Lifestyle  . Physical activity:    Days per week: Not on file    Minutes per session: Not on file  . Stress: Not on file  Relationships  . Social connections:    Talks on phone: Not on file    Gets together: Not on file    Attends religious service: Not on file    Active member of club or organization: Not on file    Attends meetings of clubs or organizations: Not on file    Relationship status: Not on file  Other Topics Concern  . Not on file  Social History Narrative   Lives with his  wife. Currently live in an apartment with his wife's mother.    Family History  Problem Relation Age of Onset  . Anxiety disorder Father      ROS Review of Systems See HPI Constitution: No fevers or chills No malaise No diaphoresis Skin: No rash or itching Eyes: no blurry vision, no double vision GU: no dysuria or hematuria Neuro: no dizziness or headaches * all others reviewed and negative   Objective: There were no vitals filed for this visit.  Physical Exam  Assessment and Plan There are no diagnoses linked to this encounter.   Ikechukwu Cerny P [employer]

## 2018-02-28 ENCOUNTER — Other Ambulatory Visit: Payer: Self-pay

## 2018-02-28 ENCOUNTER — Encounter: Payer: Self-pay | Admitting: Family Medicine

## 2018-02-28 ENCOUNTER — Ambulatory Visit: Payer: BC Managed Care – PPO | Admitting: Family Medicine

## 2018-02-28 ENCOUNTER — Encounter: Payer: Self-pay | Admitting: Gastroenterology

## 2018-02-28 VITALS — BP 116/72 | HR 88 | Temp 98.0°F | Resp 16 | Ht 72.0 in | Wt 164.0 lb

## 2018-02-28 DIAGNOSIS — K625 Hemorrhage of anus and rectum: Secondary | ICD-10-CM

## 2018-02-28 DIAGNOSIS — K6289 Other specified diseases of anus and rectum: Secondary | ICD-10-CM

## 2018-02-28 MED ORDER — HYDROCORTISONE ACETATE 25 MG RE SUPP: 25.0000 mg | Freq: Two times a day (BID) | RECTAL | 0 refills | Status: DC

## 2018-02-28 MED ORDER — DOCUSATE SODIUM 100 MG PO CAPS: 100.0000 mg | ORAL_CAPSULE | Freq: Two times a day (BID) | ORAL | 0 refills | Status: DC

## 2018-02-28 NOTE — Patient Instructions (Addendum)
     If you have lab work done today you will be contacted with your lab results within the next 2 weeks.  If you have not heard from us then please contact us. The fastest way to get your results is to register for My Chart.   IF you received an x-ray today, you will receive an invoice from [facility]. Please contact [facility] at [phone] with questions or concerns regarding your invoice.   IF you received labwork today, you will receive an invoice from [facility]. Please contact [facility] at [phone] with questions or concerns regarding your invoice.   Our billing staff will not be able to assist you with questions regarding bills from these companies.  You will be contacted with the lab results as soon as they are available. The fastest way to get your results is to activate your My Chart account. Instructions are located on the last page of this paperwork. If you have not heard from us regarding the results in 2 weeks, please contact this office.     Hemorrhoids Hemorrhoids are swollen veins in and around the rectum or anus. Hemorrhoids can cause pain, itching, or bleeding. Most of the time, they do not cause serious problems. They usually get better with diet changes, lifestyle changes, and other home treatments. Follow these instructions at home: Eating and drinking  Eat foods that have fiber, such as whole grains, beans, nuts, fruits, and vegetables. Ask your doctor about taking products that have added fiber (fibersupplements).  Drink enough fluid to keep your pee (urine) clear or pale yellow. For Pain and Swelling  Take a warm-water bath (sitz bath) for 20 minutes to ease pain. Do this 3-4 times a day.  If directed, put ice on the painful area. It may be helpful to use ice between your warm baths. ? Put ice in a plastic bag. ? Place a towel between your skin and the bag. ? Leave the ice on for 20 minutes, 2-3 times a day. General  instructions  Take over-the-counter and prescription medicines only as told by your doctor. ? Medicated creams and medicines that are inserted into the anus (suppositories) may be used or applied as told.  Exercise often.  Go to the bathroom when you have the urge to poop (to have a bowel movement). Do not wait.  Avoid pushing too hard (straining) when you poop.  Keep the butt area dry and clean. Use wet toilet paper or moist paper towels.  Do not sit on the toilet for a long time. Contact a doctor if:  You have any of these: ? Pain and swelling that do not get better with treatment or medicine. ? Bleeding that will not stop. ? Trouble pooping or you cannot poop. ? Pain or swelling outside the area of the hemorrhoids. This information is not intended to replace advice given to you by your health care provider. Make sure you discuss any questions you have with your health care provider. Document Released: 02/02/2008 Document Revised: 10/01/2015 Document Reviewed: 01/07/2015 Elsevier Interactive Patient Education  2018 Elsevier Inc.  

## 2018-02-28 NOTE — Progress Notes (Signed)
10/23/20198:18 AM  Ralph Snow [birth year], 32 y.o. male [id number]  Chief Complaint  Patient presents with  . Anal Fissure    pt states he has been having some anal pain and some blood x 3 weeks     HPI:   Patient is a 32 y.o. male with past medical history significant for anxiety and depression who presents today for rectal pain and bleeding x 3 weeks  Has had anal fissures before This reminds him of previous episodes Has intermittent issues with constipation Has never done any treatment for prior No fever, chills, nausea, vomiting, abd pain or diarrhea  Fall Risk  02/28/2018 12/27/2017 12/13/2017 05/11/2017 05/11/2017  Falls in the past year? Yes No No No No     Depression screen [facility] 2/9 02/28/2018 12/27/2017 12/13/2017  Decreased Interest 0 0 1  Down, Depressed, Hopeless 0 0 1  PHQ - 2 Score 0 0 2  Altered sleeping - - 2  Tired, decreased energy - - 2  Change in appetite - - 1  Feeling bad or failure about yourself  - - 0  Trouble concentrating - - 1  Moving slowly or fidgety/restless - - 0  Suicidal thoughts - - 0  PHQ-9 Score - - 8  Difficult doing work/chores - - -    No Known Allergies  Prior to Admission medications   Medication Sig Start Date End Date Taking? Authorizing Provider  ibuprofen (ADVIL,MOTRIN) 600 MG tablet Take 1 tablet (600 mg total) by mouth every 8 (eight) hours as needed. 05/11/17  Yes English, Judeth Cornfield D, PA  sertraline (ZOLOFT) 50 MG tablet Take 50 mg by mouth daily.   Yes [provider]    Past Medical History:  Diagnosis Date  . Allergy    seasonal  . Anxiety   . Blood transfusion without reported diagnosis   . Depression   . Seizures (HCC)    last seizure age 47.    History reviewed. No pertinent surgical history.  Social History   Tobacco Use  . Smoking status: Never Smoker  . Smokeless tobacco: Never Used  Substance Use Topics  . Alcohol use: Yes    Alcohol/week: 0.0 standard drinks    Comment: occasional     Family History  Problem Relation Age of Onset  . Anxiety disorder Father     ROS Per hpi  OBJECTIVE:  Blood pressure 116/72, pulse 88, temperature 98 F (36.7 C), temperature source Oral, resp. rate 16, height 6' (1.829 m), weight 164 lb (74.4 kg), SpO2 97 %. Body mass index is 22.24 kg/m.   Physical Exam  Constitutional: He is oriented to person, place, and time. He appears well-developed and well-nourished.  HENT:  Head: Normocephalic and atraumatic.  Mouth/Throat: Mucous membranes are normal.  Eyes: Pupils are equal, round, and reactive to light. Conjunctivae and EOM are normal.  Neck: Neck supple.  Pulmonary/Chest: Effort normal.  Genitourinary: Prostate normal. Rectal exam shows internal hemorrhoid (fullness, tender, ~ 10 oclock, ? int hem) and tenderness. Rectal exam shows no external hemorrhoid, no fissure, no mass and anal tone normal.  Musculoskeletal: Normal range of motion.  Neurological: He is alert and oriented to person, place, and time. Gait normal.  Skin: Skin is warm and dry.  Psychiatric: He has a normal mood and affect.  Nursing note and vitals reviewed.    ASSESSMENT and PLAN  1. Anal or rectal pain No anal fissure seen today, I think I palpated an internal hemorrhoid, treating empirically, referring  to GI  - Ambulatory referral to Gastroenterology  2. Bright red blood per rectum - Ambulatory referral to Gastroenterology  Other orders - hydrocortisone (ANUSOL-HC) 25 MG suppository; Place 1 suppository (25 mg total) rectally 2 (two) times daily. - docusate sodium (COLACE) 100 MG capsule; Take 1 capsule (100 mg total) by mouth 2 (two) times daily.  Return if symptoms worsen or fail to improve.    Myles Lipps, MD Primary Care at [facility] [address] [place], [place] [address] Ph.  [phone] Fax [phone]

## 2018-03-28 ENCOUNTER — Ambulatory Visit (INDEPENDENT_AMBULATORY_CARE_PROVIDER_SITE_OTHER): Payer: BC Managed Care – PPO | Admitting: Gastroenterology

## 2018-03-28 ENCOUNTER — Encounter: Payer: Self-pay | Admitting: Gastroenterology

## 2018-03-28 VITALS — BP 100/60 | HR 78 | Ht 73.0 in | Wt 171.0 lb

## 2018-03-28 DIAGNOSIS — K649 Unspecified hemorrhoids: Secondary | ICD-10-CM | POA: Diagnosis not present

## 2018-03-28 NOTE — Patient Instructions (Addendum)
You will be set up for a flexible sigmoidoscopy.  Please start taking citrucel (orange flavored) powder fiber supplement.  This may cause some bloating at first but that usually goes away. Begin with a small spoonful and work your way up to a large, heaping spoonful daily over a week.  Stay hydrated, that will help you have more regular BMs.  Stop colace for now.  Thank you for entrusting me with your care and choosing [facility].  Dr Christella HartiganJacobs

## 2018-03-28 NOTE — Progress Notes (Signed)
HPI: This is a very pleasant 32 year old man who was referred to me by Myles Lipps, MD  to evaluate rectal bleeding, anal discomfort.    Chief complaint is rectal bleeding, anal discomfort  Beginning about a year ago or so he started having some anal pain with bowel movements and some intermittent rectal bleeding at the same time.  He was told that he had a fissure by his primary care physician and was given some ointments and some recommendations.  It sounds like he did not comply with any of those.  His symptoms just improved until 2 or 3 months ago again when he started having some anal discomfort and minor rectal bleeding.  His primary care evaluated him again and felt he had hemorrhoids and not a fissure this time.  He was given Anusol and Colace with pretty good response in his symptoms.  Overall stable weight.  No colon cancer.  Bright red.   Old Data Reviewed:     Review of systems: Pertinent positive and negative review of systems were noted in the above HPI section. All other review negative.   Past Medical History:  Diagnosis Date  . Allergy    seasonal  . Anal fissure   . Anxiety   . Blood transfusion without reported diagnosis    as a child  . Depression   . Seizures (HCC)    last seizure age 75.    Past Surgical History:  Procedure Laterality Date  . MOUTH SURGERY      Current Outpatient Medications  Medication Sig Dispense Refill  . docusate sodium (COLACE) 100 MG capsule Take 1 capsule (100 mg total) by mouth 2 (two) times daily. 30 capsule 0  . hydrocortisone (ANUSOL-HC) 25 MG suppository Place 1 suppository (25 mg total) rectally 2 (two) times daily. 12 suppository 0  . ibuprofen (ADVIL,MOTRIN) 600 MG tablet Take 1 tablet (600 mg total) by mouth every 8 (eight) hours as needed. 30 tablet 0  . sertraline (ZOLOFT) 50 MG tablet Take 50 mg by mouth daily.     No current facility-administered medications for this visit.     Allergies as of  03/28/2018  . (No Known Allergies)    Family History  Problem Relation Age of Onset  . Anxiety disorder Father     Social History   Socioeconomic History  . Marital status: Married    Spouse name: Jaylend Reiland  . Number of children: 0  . Years of education: College  . Highest education level: Not on file  Occupational History  . 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 Financial resource strain: Not on file  . Food insecurity:    Worry: Not on file    Inability: Not on file  . Transportation needs:    Medical: Not on file    Non-medical: Not on file  Tobacco Use  . Smoking status: Never Smoker  . Smokeless tobacco: Never Used  Substance and Sexual Activity  . Alcohol use: Yes    Alcohol/week: 0.0 standard drinks    Comment: occasional  . Drug use: No  . Sexual activity: Yes    Partners: Female  Lifestyle  . Physical activity:    Days per week: Not on file    Minutes per session: Not on file  . Stress: Not on file  Relationships  . Social connections:    Talks on phone: Not on file  Gets together: Not on file    Attends religious service: Not on file    Active member of club or organization: Not on file    Attends meetings of clubs or organizations: Not on file    Relationship status: Not on file  . Intimate partner violence:    Fear of current or ex partner: Not on file    Emotionally abused: Not on file    Physically abused: Not on file    Forced sexual activity: Not on file  Other Topics Concern  . Not on file  Social History Narrative   Lives with his wife. Currently live in an apartment with his wife's mother.     Physical Exam: BP 100/60   Pulse 78   Ht 6\' 1"  (1.854 m)   Wt 171 lb (77.6 kg)   BMI 22.56 kg/m  Constitutional: generally well-appearing Psychiatric: alert and oriented x3 Eyes: extraocular movements intact Mouth: oral pharynx moist, no lesions Neck: supple no  lymphadenopathy Cardiovascular: heart regular rate and rhythm Lungs: clear to auscultation bilaterally Abdomen: soft, nontender, nondistended, no obvious ascites, no peritoneal signs, normal bowel sounds Extremities: no lower extremity edema bilaterally Skin: no lesions on visible extremities Rectal examination: No anal fissures, no obvious external anal hemorrhoids, digital rectal exam was normal with brown stool that was not checked for Hemoccult.  No distal rectal masses  Assessment and plan: 32 y.o. male with minor rectal bleeding, intermittent anal discomfort  I think it is very most likely that he has intermittent hemorrhoids and or fissure disease.  I explained the best way to prevent this is bulking his stools with fiber supplements which usually results in a larger but softer stool that is easy to move and easy to clean without much pushing and straining.  I also advised him to stay hydrated.  I recommended a flexible sigmoidoscopy to exclude other potential cause of the rectal bleeding which could be just beyond the reach of my digital rectal exam and he agreed.  I see no reason for any further blood tests or imaging studies prior to then.    Please see the "Patient Instructions" section for addition details about the plan.   Rob Buntinganiel Dorathy Stallone, MD [facility] 03/28/2018, 9:35 AM  Cc: Myles LippsSantiago, Irma M, MD

## 2018-04-09 ENCOUNTER — Other Ambulatory Visit: Payer: BC Managed Care – PPO | Admitting: Gastroenterology

## 2018-04-09 ENCOUNTER — Telehealth: Payer: Self-pay | Admitting: Gastroenterology

## 2018-04-09 NOTE — Telephone Encounter (Signed)
Hi Dr. Christella HartiganJacobs, this pt called to cancel his procedure scheduled for today at 3:30pm because he has to travel for work. He stated that he called during the holiday weekend to reschedule but we were closed. Thank you.

## 2018-04-09 NOTE — Telephone Encounter (Signed)
Charge late cancellation per policy

## 2018-04-30 ENCOUNTER — Telehealth: Payer: Self-pay | Admitting: Gastroenterology

## 2018-04-30 ENCOUNTER — Other Ambulatory Visit: Payer: BC Managed Care – PPO | Admitting: Gastroenterology

## 2018-07-23 ENCOUNTER — Other Ambulatory Visit: Payer: Self-pay

## 2018-07-23 ENCOUNTER — Encounter: Payer: Self-pay | Admitting: Family Medicine

## 2018-07-23 ENCOUNTER — Ambulatory Visit: Payer: BC Managed Care – PPO | Admitting: Family Medicine

## 2018-07-23 VITALS — BP 122/74 | HR 96 | Temp 98.5°F | Resp 18 | Ht 73.0 in | Wt 178.4 lb

## 2018-07-23 DIAGNOSIS — B349 Viral infection, unspecified: Secondary | ICD-10-CM

## 2018-07-23 DIAGNOSIS — F325 Major depressive disorder, single episode, in full remission: Secondary | ICD-10-CM | POA: Diagnosis not present

## 2018-07-23 MED ORDER — SERTRALINE HCL 50 MG PO TABS: 50.0000 mg | ORAL_TABLET | Freq: Every day | ORAL | 1 refills | Status: DC

## 2018-07-23 NOTE — Patient Instructions (Signed)
° ° ° °  If you have lab work done today you will be contacted with your lab results within the next 2 weeks.  If you have not heard from us then please contact us. The fastest way to get your results is to register for My Chart. ° ° °IF you received an x-ray today, you will receive an invoice from [facility]. Please contact [facility] at [phone] with questions or concerns regarding your invoice.  ° °IF you received labwork today, you will receive an invoice from [facility]. Please contact [facility] at [phone] with questions or concerns regarding your invoice.  ° °Our billing staff will not be able to assist you with questions regarding bills from these companies. ° °You will be contacted with the lab results as soon as they are available. The fastest way to get your results is to activate your My Chart account. Instructions are located on the last page of this paperwork. If you have not heard from us regarding the results in 2 weeks, please contact this office. °  ° ° ° °

## 2018-07-23 NOTE — Progress Notes (Signed)
3/16/202010:28 AM  Ralph Snow May 12, 1985, 33 y.o. male [id number]  Chief Complaint  Patient presents with  . Sinus Problem  . Fatigue    x2weeks couldnt really eat didnt feel well     HPI:   Patient is a 33 y.o. male with past medical history significant for depression who presents today for malaise  About 2 weeks ago, on and off feeling weak, malaise, headache No cough or body ache, no nasal congestion or rhinorrhea Last Monday had to leave work early and slept for most of the day Was feeling nauseous, no vomiting or diarrhea No travel Has not had flu vaccine this season Overall feeling better  Doing well on sertraline 50mg  Denies any side effects Depression well controlled, feels sertraline has helped with thoughts of passive SI, anhedonia, allows him to not become overwhelmed Involved in counseling, changed therapist recently, realized difficulty with being emotionally available since then Diagnosed with depression about a year ago, deaths in the family, many stressors    Fall Risk  07/23/2018 02/28/2018 12/27/2017 12/13/2017 05/11/2017  Falls in the past year? 0 Yes No No No  Follow up Falls evaluation completed - - - -     Depression screen [facility] 2/9 07/23/2018 02/28/2018 12/27/2017  Decreased Interest 0 0 0  Down, Depressed, Hopeless 0 0 0  PHQ - 2 Score 0 0 0  Altered sleeping - - -  Tired, decreased energy - - -  Change in appetite - - -  Feeling bad or failure about yourself  - - -  Trouble concentrating - - -  Moving slowly or fidgety/restless - - -  Suicidal thoughts - - -  PHQ-9 Score - - -  Difficult doing work/chores - - -    No Known Allergies  Prior to Admission medications   Medication Sig Start Date End Date Taking? Authorizing Provider  sertraline (ZOLOFT) 50 MG tablet Take 50 mg by mouth daily.   Yes [provider]  ibuprofen (ADVIL,MOTRIN) 600 MG tablet Take 1 tablet (600 mg total) by mouth every 8 (eight) hours as needed. Patient  not taking: Reported on 07/23/2018 05/11/17   Garnetta Buddy, PA    Past Medical History:  Diagnosis Date  . Allergy    seasonal  . Anal fissure   . Anxiety   . Blood transfusion without reported diagnosis    as a child  . Depression   . Seizures (HCC)    last seizure age 40.    Past Surgical History:  Procedure Laterality Date  . MOUTH SURGERY      Social History   Tobacco Use  . Smoking status: Never Smoker  . Smokeless tobacco: Never Used  Substance Use Topics  . Alcohol use: Yes    Alcohol/week: 0.0 standard drinks    Comment: occasional    Family History  Problem Relation Age of Onset  . Anxiety disorder Father     ROS Per hpi  OBJECTIVE:  Blood pressure 122/74, pulse 96, temperature 98.5 F (36.9 C), temperature source Oral, resp. rate 18, height 6\' 1"  (1.854 m), weight 178 lb 6.4 oz (80.9 kg), SpO2 97 %. Body mass index is 23.54 kg/m.   Physical Exam Vitals signs and nursing note reviewed.  Constitutional:      Appearance: He is well-developed.  HENT:     Head: Normocephalic and atraumatic.     Right Ear: Hearing, tympanic membrane, ear canal and external ear normal.     Left Ear:  Hearing, tympanic membrane, ear canal and external ear normal.     Mouth/Throat:     Pharynx: No oropharyngeal exudate.  Eyes:     Conjunctiva/sclera: Conjunctivae normal.     Pupils: Pupils are equal, round, and reactive to light.  Neck:     Musculoskeletal: Neck supple.  Cardiovascular:     Rate and Rhythm: Normal rate and regular rhythm.     Heart sounds: No murmur. No friction rub. No gallop.   Pulmonary:     Effort: Pulmonary effort is normal.     Breath sounds: Normal breath sounds. No wheezing or rales.  Lymphadenopathy:     Cervical: No cervical adenopathy.  Skin:    General: Skin is warm and dry.  Neurological:     Mental Status: He is alert and oriented to person, place, and time.     ASSESSMENT and PLAN  1. Viral syndrome Resolved  2.  Major depressive disorder with single episode, in full remission (HCC) Controlled. Continue current regime.   Other orders - sertraline (ZOLOFT) 50 MG tablet; Take 1 tablet (50 mg total) by mouth daily.  Return in about 6 months (around 01/23/2019) for depression.    Ralph Lipps, MD Primary Care at [facility] [address] [place], [place] [address] Ph.  [phone] Fax [phone]

## 2018-08-24 ENCOUNTER — Telehealth: Payer: Self-pay

## 2018-08-24 NOTE — Telephone Encounter (Signed)
Pt has called [place] and transferred the call via the PEC.  I have spoken to the pt and he stated that he feels like he took a double dose of the zoloft. Since he is only on the 50 mg then should monitor himself. He was having a panic attack and if he is still filling anxious or tightness of chest he should proceed to the ED or Urgent care. He stated understanding.

## 2018-10-03 ENCOUNTER — Ambulatory Visit (INDEPENDENT_AMBULATORY_CARE_PROVIDER_SITE_OTHER)
Admission: RE | Admit: 2018-10-03 | Discharge: 2018-10-03 | Disposition: A | Discharge status: Home / Self Care | Payer: BC Managed Care – PPO | Source: Ambulatory Visit

## 2018-10-03 ENCOUNTER — Inpatient Hospital Stay: Admission: RE | Admit: 2018-10-03 | Payer: BC Managed Care – PPO | Source: Ambulatory Visit

## 2018-10-03 DIAGNOSIS — J302 Other seasonal allergic rhinitis: Secondary | ICD-10-CM | POA: Diagnosis not present

## 2018-10-03 MED ORDER — FLUTICASONE PROPIONATE 50 MCG/ACT NA SUSP: 1.0000 | Freq: Every day | NASAL | 2 refills | Status: DC

## 2018-10-03 NOTE — ED Provider Notes (Signed)
Virtual Visit via Video Note:  Ralph Snow  initiated request for Telemedicine visit with [facility] Urgent [facility] team. I connected with Ralph Snow  on 10/03/2018 at 12:01 PM  for a synchronized telemedicine visit using a video enabled HIPPA compliant telemedicine application. I verified that I am speaking with Ralph Snow  using two identifiers. Georgetta Haber, NP  was physically located in a [facility] Urgent care site and Ralph Snow was located at a different location.   The limitations of evaluation and management by telemedicine as well as the availability of in-person appointments were discussed. Patient was informed that he  may incur a bill ( including co-pay) for this virtual visit encounter. Ralph Snow  expressed understanding and gave verbal consent to proceed with virtual visit.     History of Present Illness:Ralph Snow  is a 33 y.o. male presents with complaints of allergy symptoms which started Friday 5/22 after being outside. Concerned about corona virus potential. Feels congestion, eyes feel swollen. Hx of allergies. When wake up eyes have pain. No cough. No shortness of breath. Minimal runny nose now, but was runny when symptoms started when was outside. Slight tickle in throat when woke up one day, but then resolved. No fevers. No headache, no body aches. No known ill contacts. Wears masks when out. Has had some sneezing. No gi complaints. Taking claritin.  Past Medical History:  Diagnosis Date  . Allergy    seasonal  . Anal fissure   . Anxiety   . Blood transfusion without reported diagnosis    as a child  . Depression   . Seizures (HCC)    last seizure age 59.    No Known Allergies      Observations/Objective: Alert, interactive, non toxic in appearance. No obvious rhinorrhea, no cough throughout exam, no voice hoarseness. Eye conjunctiva appear wnl, no swelling. Speaking in complete sentences without difficulty  Assessment and Plan: History sounds much  more consistent with allergies. Patient voices concern about covid-19. Recommended testing if would like to know definitively. Allergy treatments discussed. Return precautions provided. Patient verbalized understanding and agreeable to plan.    Follow Up Instructions:    I discussed the assessment and treatment plan with the patient. The patient was provided an opportunity to ask questions and all were answered. The patient agreed with the plan and demonstrated an understanding of the instructions.   The patient was advised to call back or seek an in-person evaluation if the symptoms worsen or if the condition fails to improve as anticipated.  I provided 13 minutes of non-face-to-face time during this encounter.    Georgetta Haber, NP  10/03/2018 12:01 PM         Georgetta Haber, NP 10/03/18 1203

## 2018-10-03 NOTE — Discharge Instructions (Signed)
Nice to meet you! This sounds more like allergy symptoms to me rather than covid-19.  Testing is the only way to say definitively, however.  Continue with daily Claritin or zyrtec. May add in benadryl at night.  Daily flonase may also be helpful. Self-isolation for 7 days from onset of symptoms as long as without fever for 3 days is recommendation if this is covid.  If symptoms worsen or do not improve in the next week to return to be seen or to follow up with your PCP.

## 2018-11-15 ENCOUNTER — Other Ambulatory Visit: Payer: Self-pay | Admitting: Family Medicine

## 2018-11-15 DIAGNOSIS — Z20822 Contact with and (suspected) exposure to covid-19: Secondary | ICD-10-CM

## 2018-11-20 LAB — NOVEL CORONAVIRUS, NAA: SARS-CoV-2, NAA: NOT DETECTED

## 2019-01-03 ENCOUNTER — Ambulatory Visit: Payer: BC Managed Care – PPO | Admitting: Family Medicine

## 2019-01-03 ENCOUNTER — Other Ambulatory Visit: Payer: Self-pay

## 2019-01-03 ENCOUNTER — Encounter: Payer: Self-pay | Admitting: Family Medicine

## 2019-01-03 VITALS — BP 130/76 | HR 95 | Temp 98.6°F | Ht 73.0 in | Wt 189.0 lb

## 2019-01-03 DIAGNOSIS — K602 Anal fissure, unspecified: Secondary | ICD-10-CM | POA: Diagnosis not present

## 2019-01-03 DIAGNOSIS — K644 Residual hemorrhoidal skin tags: Secondary | ICD-10-CM

## 2019-01-03 MED ORDER — NITROGLYCERIN 0.4 % RE OINT: 1.0000 "application " | TOPICAL_OINTMENT | Freq: Two times a day (BID) | RECTAL | 3 refills | Status: DC

## 2019-01-03 NOTE — Progress Notes (Signed)
   8/27/202012:11 PM  Ralph Snow Nov 29, 1985, 33 y.o., male [id number]  Chief Complaint  Patient presents with  . Mass    discovered bump near anus on Monday has concerns, no pain right now    HPI:   Patient is a 33 y.o. male who presents today for concerning of soft rectal mass that he felt 4 days ago which has sign improved since then  Associated with mild rectal pain with scant bright blood He has had anal fissures in the past  Denies any recent problems with constipation   Depression screen [facility] [place] 2/9 01/03/2019 07/23/2018 02/28/2018  Decreased Interest 0 0 0  Down, Depressed, Hopeless 0 0 0  PHQ - 2 Score 0 0 0  Altered sleeping - - -  Tired, decreased energy - - -  Change in appetite - - -  Feeling bad or failure about yourself  - - -  Trouble concentrating - - -  Moving slowly or fidgety/restless - - -  Suicidal thoughts - - -  PHQ-9 Score - - -  Difficult doing work/chores - - -    Fall Risk  01/03/2019 07/23/2018 02/28/2018 12/27/2017 12/13/2017  Falls in the past year? 0 0 Yes No No  Number falls in past yr: 0 - - - -  Injury with Fall? 0 - - - -  Follow up - Falls evaluation completed - - -     No Known Allergies  Prior to Admission medications   Medication Sig Start Date End Date Taking? Authorizing Provider  loratadine (CLARITIN) 10 MG tablet Take 10 mg by mouth daily.    [provider]  sertraline (ZOLOFT) 50 MG tablet Take 1 tablet (50 mg total) by mouth daily. 07/23/18   Rutherford Guys, MD    Past Medical History:  Diagnosis Date  . Allergy    seasonal  . Anal fissure   . Anxiety   . Blood transfusion without reported diagnosis    as a child  . Depression   . Seizures ([place])    last seizure age 12.    Past Surgical History:  Procedure Laterality Date  . MOUTH SURGERY      Social History   Tobacco Use  . Smoking status: Never Smoker  . Smokeless tobacco: Never Used  Substance Use Topics  . Alcohol use: Yes    Alcohol/week:  0.0 standard drinks    Comment: occasional    Family History  Problem Relation Age of Onset  . Anxiety disorder Father     ROS Per hpi  OBJECTIVE:  Today's Vitals   01/03/19 1144  BP: 130/76  Pulse: 95  Temp: 98.6 F (37 C)  SpO2: 96%  Weight: 189 lb (85.7 kg)  Height: 6\' 1"  (1.854 m)   Body mass index is 24.94 kg/m.   Physical Exam   Gen: AAOx3, NAD GU: chaperone present, small anterior rectal fissure with small external hemorrhoid  No results found for this or any previous visit (from the past 24 hour(s)).  No results found.   ASSESSMENT and PLAN  1. Anal fissure 2. External hemorrhoid Discussed supportive measures, new meds r/se/b and RTC precautions. Patient educational handout given.  Other orders - Nitroglycerin 0.4 % OINT; Place 1 application rectally 2 (two) times daily.  Return if symptoms worsen or fail to improve.    Rutherford Guys, MD Primary Care at [place], [place] [address] Ph.  [phone] Fax [phone]

## 2019-01-03 NOTE — Patient Instructions (Addendum)
   If you have lab work done today you will be contacted with your lab results within the next 2 weeks.  If you have not heard from us then please contact us. The fastest way to get your results is to register for My Chart.   IF you received an x-ray today, you will receive an invoice from [facility]. Please contact [facility] at [phone] with questions or concerns regarding your invoice.   IF you received labwork today, you will receive an invoice from [facility]. Please contact [facility] at [phone] with questions or concerns regarding your invoice.   Our billing staff will not be able to assist you with questions regarding bills from these companies.  You will be contacted with the lab results as soon as they are available. The fastest way to get your results is to activate your My Chart account. Instructions are located on the last page of this paperwork. If you have not heard from us regarding the results in 2 weeks, please contact this office.     Hemorrhoids Hemorrhoids are swollen veins that may develop:  In the butt (rectum). These are called internal hemorrhoids.  Around the opening of the butt (anus). These are called external hemorrhoids. Hemorrhoids can cause pain, itching, or bleeding. Most of the time, they do not cause serious problems. They usually get better with diet changes, lifestyle changes, and other home treatments. What are the causes? This condition may be caused by:  Having trouble pooping (constipation).  Pushing hard (straining) to poop.  Watery poop (diarrhea).  Pregnancy.  Being very overweight (obese).  Sitting for long periods of time.  Heavy lifting or other activity that causes you to strain.  Anal sex.  Riding a bike for a long period of time. What are the signs or symptoms? Symptoms of this condition include:  Pain.  Itching or soreness in the butt.  Bleeding from the butt.  Leaking poop.  Swelling  in the area.  One or more lumps around the opening of your butt. How is this diagnosed? A doctor can often diagnose this condition by looking at the affected area. The doctor may also:  Do an exam that involves feeling the area with a gloved hand (digital rectal exam).  Examine the area inside your butt using a small tube (anoscope).  Order blood tests. This may be done if you have lost a lot of blood.  Have you get a test that involves looking inside the colon using a flexible tube with a camera on the end (sigmoidoscopy or colonoscopy). How is this treated? This condition can usually be treated at home. Your doctor may tell you to change what you eat, make lifestyle changes, or try home treatments. If these do not help, procedures can be done to remove the hemorrhoids or make them smaller. These may involve:  Placing rubber bands at the base of the hemorrhoids to cut off their blood supply.  Injecting medicine into the hemorrhoids to shrink them.  Shining a type of light energy onto the hemorrhoids to cause them to fall off.  Doing surgery to remove the hemorrhoids or cut off their blood supply. Follow these instructions at home: Eating and drinking   Eat foods that have a lot of fiber in them. These include whole grains, beans, nuts, fruits, and vegetables.  Ask your doctor about taking products that have added fiber (fibersupplements).  Reduce the amount of fat in your diet. You can do this by: ?   Eating low-fat dairy products. ? Eating less red meat. ? Avoiding processed foods.  Drink enough fluid to keep your pee (urine) pale yellow. Managing pain and swelling   Take a warm-water bath (sitz bath) for 20 minutes to ease pain. Do this 3-4 times a day. You may do this in a bathtub or using a portable sitz bath that fits over the toilet.  If told, put ice on the painful area. It may be helpful to use ice between your warm baths. ? Put ice in a plastic bag. ? Place a towel  between your skin and the bag. ? Leave the ice on for 20 minutes, 2-3 times a day. General instructions  Take over-the-counter and prescription medicines only as told by your doctor. ? Medicated creams and medicines may be used as told.  Exercise often. Ask your doctor how much and what kind of exercise is best for you.  Go to the bathroom when you have the urge to poop. Do not wait.  Avoid pushing too hard when you poop.  Keep your butt dry and clean. Use wet toilet paper or moist towelettes after pooping.  Do not sit on the toilet for a long time.  Keep all follow-up visits as told by your doctor. This is important. Contact a doctor if you:  Have pain and swelling that do not get better with treatment or medicine.  Have trouble pooping.  Cannot poop.  Have pain or swelling outside the area of the hemorrhoids. Get help right away if you have:  Bleeding that will not stop. Summary  Hemorrhoids are swollen veins in the butt or around the opening of the butt.  They can cause pain, itching, or bleeding.  Eat foods that have a lot of fiber in them. These include whole grains, beans, nuts, fruits, and vegetables.  Take a warm-water bath (sitz bath) for 20 minutes to ease pain. Do this 3-4 times a day. This information is not intended to replace advice given to you by your health care provider. Make sure you discuss any questions you have with your health care provider. Document Released: 02/02/2008 Document Revised: 05/03/2018 Document Reviewed: 09/14/2017 Elsevier Patient Education  2020 [employer]  Anal Fissure, Adult  An anal fissure is a small tear or crack in the tissue around the opening of the butt (anus). Bleeding from the tear or crack usually stops on its own within a few minutes. The bleeding may happen every time you poop (have a bowel movement) until the tear or crack heals. What are the causes? This condition is usually caused by passing a large or hard  poop (stool). Other causes include:  Trouble pooping (constipation).  Passing watery poop (diarrhea).  Inflammatory bowel disease (Crohn's disease or ulcerative colitis).  Childbirth.  Infections.  Anal sex. What are the signs or symptoms? Symptoms of this condition include:  Bleeding from the butt.  Small amounts of blood on your poop. The blood coats the outside of the poop. It is not mixed with the poop.  Small amounts of blood on the toilet paper or in the toilet after you poop.  Pain when passing poop.  Itching or irritation around the opening of the butt. How is this diagnosed? This condition may be diagnosed based on a physical exam. Your doctor may:  Check your butt. A tear can often be seen by checking the area with care.  Check your butt using a short tube (anoscope). The light in the tube will show   any problems in your butt. How is this treated? Treatment for this condition may include:  Treating problems that make it hard for you to pass poop. You may be told to: ? Eat more fiber. ? Drink more fluid. ? Take fiber supplements. ? Take medicines that make poop soft.  Taking sitz baths. This may help to heal the tear.  Using creams and ointments. If your condition gets worse, other treatments may be needed such as:  A shot near the tear or crack (botulinum injection).  Surgery to repair the tear or crack. Follow these instructions at home: Eating and drinking   Avoid bananas and dairy products. These foods can make it hard to poop.  Drink enough fluid to keep your pee (urine) pale yellow.  Eat foods that have a lot of fiber in them, such as: ? Beans. ? Whole grains. ? Fresh fruits. ? Fresh vegetables. General instructions   Take over-the-counter and prescription medicines only as told by your doctor.  Use creams or ointments only as told by your doctor.  Keep the butt area as clean and dry as you can.  Take a warm water bath (sitz bath) as  told by your doctor. Do not use soap.  Keep all follow-up visits as told by your doctor. This is important. Contact a doctor if:  You have more bleeding.  You have a fever.  You have watery poop that is mixed with blood.  You have pain.  Your problem gets worse, not better. Summary  An anal fissure is a small tear or crack in the skin around the opening of the butt (anus).  This condition is usually caused by passing a large or hard poop (stool).  Treatment includes treating the problems that make it hard for you to pass poop.  Follow your doctor's instructions about caring for your condition at home.  Keep all follow-up visits as told by your doctor. This is important. This information is not intended to replace advice given to you by your health care provider. Make sure you discuss any questions you have with your health care provider. Document Released: 12/22/2010 Document Revised: 10/05/2017 Document Reviewed: 10/05/2017 Elsevier Patient Education  2020 Elsevier Inc.  

## 2019-01-24 ENCOUNTER — Other Ambulatory Visit: Payer: Self-pay

## 2019-01-24 ENCOUNTER — Encounter: Payer: Self-pay | Admitting: Family Medicine

## 2019-01-24 ENCOUNTER — Ambulatory Visit: Payer: BC Managed Care – PPO | Admitting: Family Medicine

## 2019-01-24 VITALS — BP 118/70 | HR 97 | Temp 98.2°F | Resp 18 | Ht 73.0 in | Wt 190.0 lb

## 2019-01-24 DIAGNOSIS — L237 Allergic contact dermatitis due to plants, except food: Secondary | ICD-10-CM

## 2019-01-24 DIAGNOSIS — L739 Follicular disorder, unspecified: Secondary | ICD-10-CM | POA: Diagnosis not present

## 2019-01-24 MED ORDER — DOXYCYCLINE HYCLATE 100 MG PO CAPS: 100.0000 mg | ORAL_CAPSULE | Freq: Two times a day (BID) | ORAL | 0 refills | Status: DC

## 2019-01-24 MED ORDER — TRIAMCINOLONE ACETONIDE 0.1 % EX CREA: 1.0000 "application " | TOPICAL_CREAM | Freq: Two times a day (BID) | CUTANEOUS | 0 refills | Status: DC

## 2019-01-24 NOTE — Patient Instructions (Signed)
Wash the area of poison ivy well with soap and water  Apply triamcinolone cream 2 or 3 times daily to the rash  Take doxycycline 100 mg 1 twice daily for the infection in the pubic hair.  If that is itching or irritating you can use some of the triamcinolone cream there also.  Return if further problems.  Think again about getting a flu shot.

## 2019-01-24 NOTE — Progress Notes (Signed)
Patient ID: Ralph Snow, male    DOB: 31-Oct-1985  Age: 33 y.o. MRN: [id number]  Chief Complaint  Patient presents with  . Rash    inside R elbow, itching, redness, started 09/15  . Mass    near penis, noticed on 09/14    Subjective:   Patient had been doing some yard work and has developed a rash in his right antecubital fossa.  He also for for 5 days has had a little lump and tender area above his penis on the pubic area.  Current allergies, medications, problem list, past/family and social histories reviewed.  Objective:  BP 118/70 (BP Location: Right Arm, Patient Position: Sitting, Cuff Size: Normal)   Pulse 97   Temp 98.2 F (36.8 C) (Oral)   Resp 18   Ht 6\' 1"  (1.854 m)   Wt 190 lb (86.2 kg)   SpO2 97%   BMI 25.07 kg/m   No major acute distress.  Has an area of of small blisters and linear streaking in the right antecubital fossa consistent with contact dermatitis.  Just above his penis in the pubic hair along the mid pubis region he has mild folliculitis and induration of the tissue.  Assessment & Plan:   Assessment: 1. Poison ivy dermatitis   2. Folliculitis       Plan: Think these are 2 separate problems.  Will treat with doxycycline for the folliculitis and the contact dermatitis is not bad enough to require systemic treatment so we will treat with local therapy.  Urged him to get a flu shot which he does not want but will consider.  Will be in here in the near future for physical anyhow.  No orders of the defined types were placed in this encounter.   Meds ordered this encounter  Medications  . triamcinolone cream (KENALOG) 0.1 %    Sig: Apply 1 application topically 2 (two) times daily.    Dispense:  30 g    Refill:  0  . doxycycline (VIBRAMYCIN) 100 MG capsule    Sig: Take 1 capsule (100 mg total) by mouth 2 (two) times daily.    Dispense:  20 capsule    Refill:  0         Patient Instructions  Wash the area of poison ivy well with soap  and water  Apply triamcinolone cream 2 or 3 times daily to the rash  Take doxycycline 100 mg 1 twice daily for the infection in the pubic hair.  If that is itching or irritating you can use some of the triamcinolone cream there also.  Return if further problems.  Think again about getting a flu shot.    Return if symptoms worsen or fail to improve.   Ruben Reason, MD 01/24/2019

## 2019-02-12 ENCOUNTER — Encounter: Payer: BC Managed Care – PPO | Admitting: Family Medicine

## 2019-02-13 ENCOUNTER — Other Ambulatory Visit: Payer: Self-pay | Admitting: Critical Care Medicine

## 2019-02-13 DIAGNOSIS — Z20822 Contact with and (suspected) exposure to covid-19: Secondary | ICD-10-CM

## 2019-02-15 LAB — NOVEL CORONAVIRUS, NAA: SARS-CoV-2, NAA: NOT DETECTED

## 2019-03-05 ENCOUNTER — Encounter (INDEPENDENT_AMBULATORY_CARE_PROVIDER_SITE_OTHER): Payer: BC Managed Care – PPO | Admitting: Family Medicine

## 2019-03-06 ENCOUNTER — Encounter: Payer: Self-pay | Admitting: Family Medicine

## 2019-03-26 ENCOUNTER — Ambulatory Visit: Payer: BC Managed Care – PPO | Admitting: Family Medicine

## 2019-03-26 ENCOUNTER — Encounter: Payer: Self-pay | Admitting: Family Medicine

## 2019-03-26 ENCOUNTER — Other Ambulatory Visit: Payer: Self-pay

## 2019-03-26 VITALS — BP 120/75 | HR 87 | Temp 98.5°F | Ht 73.0 in | Wt 192.6 lb

## 2019-03-26 DIAGNOSIS — F1010 Alcohol abuse, uncomplicated: Secondary | ICD-10-CM | POA: Diagnosis not present

## 2019-03-26 DIAGNOSIS — R0683 Snoring: Secondary | ICD-10-CM

## 2019-03-26 NOTE — Patient Instructions (Signed)
° ° ° °  If you have lab work done today you will be contacted with your lab results within the next 2 weeks.  If you have not heard from us then please contact us. The fastest way to get your results is to register for My Chart. ° ° °IF you received an x-ray today, you will receive an invoice from [facility]. Please contact  Radiology at [phone] with questions or concerns regarding your invoice.  ° °IF you received labwork today, you will receive an invoice from [facility]. Please contact [facility] at [phone] with questions or concerns regarding your invoice.  ° °Our billing staff will not be able to assist you with questions regarding bills from these companies. ° °You will be contacted with the lab results as soon as they are available. The fastest way to get your results is to activate your My Chart account. Instructions are located on the last page of this paperwork. If you have not heard from us regarding the results in 2 weeks, please contact this office. °  ° ° ° °

## 2019-03-26 NOTE — Progress Notes (Signed)
11/17/202011:47 AM  Ralph Snow 1985/07/29, 33 y.o., male [id number]  Chief Complaint  Patient presents with  . Sleeping Problem    snoring, thinks it may have started in college, uses humidifier  . Pain    stomach pain due to alcohol consumption, does not think he has px with drinking    HPI:   Patient is a 33 y.o. male  who presents today with several concerns  Snoring - he has always snored, very loud, sometimes wakes up tired, does not wake up with headache, denies excessive daytime sleepiness, his wife has noticed apnea once, he has woken up infrequently gasping for air, uses breathing strips and mouth guard he was wondering about sleep study  Etoh - lately drinking more, past several weeks, since death of his grandfather, past weekend drunk too much, the following day he was not feeling well, his stomach was hurting, feeling queasy, today feeling well Going to counseling  Depression screen [facility] 2/9 03/26/2019 01/24/2019 01/03/2019  Decreased Interest 0 0 0  Down, Depressed, Hopeless 0 0 0  PHQ - 2 Score 0 0 0  Altered sleeping - 0 -  Tired, decreased energy - 0 -  Change in appetite - 0 -  Feeling bad or failure about yourself  - 0 -  Trouble concentrating - 0 -  Moving slowly or fidgety/restless - 0 -  Suicidal thoughts - 0 -  PHQ-9 Score - 0 -  Difficult doing work/chores - Not difficult at all -    Fall Risk  03/26/2019 01/03/2019 07/23/2018 02/28/2018 12/27/2017  Falls in the past year? 0 0 0 Yes No  Number falls in past yr: 0 0 - - -  Injury with Fall? 0 0 - - -  Follow up - - Falls evaluation completed - -     No Known Allergies  Prior to Admission medications   Medication Sig Start Date End Date Taking? Authorizing Provider  sertraline (ZOLOFT) 50 MG tablet Take 1 tablet (50 mg total) by mouth daily. 07/23/18  Yes Rutherford Guys, MD    Past Medical History:  Diagnosis Date  . Allergy    seasonal  . Anal fissure   . Anxiety   . Blood transfusion  without reported diagnosis    as a child  . Depression   . Seizures ([place])    last seizure age 77.    Past Surgical History:  Procedure Laterality Date  . MOUTH SURGERY      Social History   Tobacco Use  . Smoking status: Never Smoker  . Smokeless tobacco: Never Used  Substance Use Topics  . Alcohol use: Yes    Alcohol/week: 0.0 standard drinks    Comment: occasional    Family History  Problem Relation Age of Onset  . Anxiety disorder Father     Review of Systems  Constitutional: Negative for chills and fever.  Respiratory: Negative for cough and shortness of breath.   Cardiovascular: Negative for chest pain, palpitations and leg swelling.  Gastrointestinal: Positive for nausea. Negative for abdominal pain, blood in stool, melena and vomiting.     OBJECTIVE:  Today's Vitals   03/26/19 1124  BP: 120/75  Pulse: 87  Temp: 98.5 F (36.9 C)  SpO2: 96%  Weight: 192 lb 9.6 oz (87.4 kg)  Height: 6\' 1"  (1.854 m)   Body mass index is 25.41 kg/m.   Physical Exam Vitals signs and nursing note reviewed.  Constitutional:      Appearance:  He is well-developed.  HENT:     Head: Normocephalic and atraumatic.  Eyes:     Conjunctiva/sclera: Conjunctivae normal.     Pupils: Pupils are equal, round, and reactive to light.  Neck:     Musculoskeletal: Neck supple.  Cardiovascular:     Rate and Rhythm: Normal rate and regular rhythm.     Heart sounds: No murmur. No friction rub. No gallop.   Pulmonary:     Effort: Pulmonary effort is normal.     Breath sounds: Normal breath sounds. No wheezing or rales.  Abdominal:     General: Bowel sounds are normal. There is no distension.     Palpations: Abdomen is soft. There is no hepatomegaly or splenomegaly.     Tenderness: There is no abdominal tenderness.  Skin:    General: Skin is warm and dry.  Neurological:     Mental Status: He is alert and oriented to person, place, and time.     No results found for this or any  previous visit (from the past 24 hour(s)).  No results found.   ASSESSMENT and PLAN  1. Snoring - Ambulatory referral to Sleep Studies  2. Alcohol consumption binge drinking Discussed concerns for behaviors, continue counseling, reviewed recommended limits - CBC - Lipase - Comprehensive metabolic panel  Return for after sleep eval.    Myles Lipps, MD Primary Care at [facility] [address] [place], [place] [address] Ph.  [phone] Fax [phone]

## 2019-03-27 LAB — CBC
Hematocrit: 43.4 % (ref 37.5–51.0)
Hemoglobin: 14.5 g/dL (ref 13.0–17.7)
MCH: 30.6 pg (ref 26.6–33.0)
MCHC: 33.4 g/dL (ref 31.5–35.7)
MCV: 92 fL (ref 79–97)
Platelets: 269 10*3/uL (ref 150–450)
RBC: 4.74 x10E6/uL (ref 4.14–5.80)
RDW: 11.1 % — ABNORMAL LOW (ref 11.6–15.4)
WBC: 6.3 10*3/uL (ref 3.4–10.8)

## 2019-03-27 LAB — COMPREHENSIVE METABOLIC PANEL
ALT: 19 IU/L (ref 0–44)
AST: 24 IU/L (ref 0–40)
Albumin/Globulin Ratio: 1.8 (ref 1.2–2.2)
Albumin: 4.5 g/dL (ref 4.1–5.2)
Alkaline Phosphatase: 89 IU/L (ref 39–117)
BUN/Creatinine Ratio: 15 (ref 9–20)
BUN: 11 mg/dL (ref 6–20)
Bilirubin Total: 0.3 mg/dL (ref 0.0–1.2)
CO2: 22 mmol/L (ref 20–29)
Calcium: 9.1 mg/dL (ref 8.7–10.2)
Chloride: 100 mmol/L (ref 96–106)
Creatinine, Ser: 0.75 mg/dL — ABNORMAL LOW (ref 0.76–1.27)
GFR calc Af Amer: 142 mL/min/{1.73_m2} (ref >59)
GFR calc non Af Amer: 123 mL/min/{1.73_m2} (ref >59)
Globulin, Total: 2.5 g/dL (ref 1.5–4.5)
Glucose: 78 mg/dL (ref 65–99)
Potassium: 4.4 mmol/L (ref 3.5–5.2)
Sodium: 139 mmol/L (ref 134–144)
Total Protein: 7 g/dL (ref 6.0–8.5)

## 2019-03-27 LAB — LIPASE: Lipase: 28 U/L (ref 13–78)

## 2019-03-30 ENCOUNTER — Encounter: Payer: Self-pay | Admitting: Family Medicine

## 2019-04-02 ENCOUNTER — Other Ambulatory Visit: Payer: Self-pay

## 2019-04-02 ENCOUNTER — Encounter: Payer: Self-pay | Admitting: Family Medicine

## 2019-04-02 ENCOUNTER — Ambulatory Visit (INDEPENDENT_AMBULATORY_CARE_PROVIDER_SITE_OTHER): Payer: BC Managed Care – PPO | Admitting: Family Medicine

## 2019-04-02 ENCOUNTER — Other Ambulatory Visit (HOSPITAL_COMMUNITY)
Admission: RE | Admit: 2019-04-02 | Discharge: 2019-04-02 | Disposition: A | Discharge status: Home / Self Care | Payer: BC Managed Care – PPO | Source: Ambulatory Visit | Attending: Family Medicine | Admitting: Family Medicine

## 2019-04-02 VITALS — BP 110/73 | HR 97 | Temp 98.7°F | Ht 73.0 in | Wt 191.0 lb

## 2019-04-02 DIAGNOSIS — Z113 Encounter for screening for infections with a predominantly sexual mode of transmission: Secondary | ICD-10-CM

## 2019-04-02 DIAGNOSIS — E7800 Pure hypercholesterolemia, unspecified: Secondary | ICD-10-CM

## 2019-04-02 DIAGNOSIS — Z0001 Encounter for general adult medical examination with abnormal findings: Secondary | ICD-10-CM | POA: Diagnosis not present

## 2019-04-02 DIAGNOSIS — F325 Major depressive disorder, single episode, in full remission: Secondary | ICD-10-CM | POA: Diagnosis not present

## 2019-04-02 DIAGNOSIS — Z0000 Encounter for general adult medical examination without abnormal findings: Secondary | ICD-10-CM

## 2019-04-02 DIAGNOSIS — K602 Anal fissure, unspecified: Secondary | ICD-10-CM

## 2019-04-02 DIAGNOSIS — Z13220 Encounter for screening for lipoid disorders: Secondary | ICD-10-CM

## 2019-04-02 NOTE — Progress Notes (Signed)
11/24/20209:23 AM  Ralph Snow 15-Jun-1985, 33 y.o., male [id number]  Chief Complaint  Patient presents with  . Annual Exam    HPI:   Patient is a 33 y.o. male who presents today for CPE  Colorectal Cancer Screening: at age 69, PGF colon CA in his 27s Prostate Cancer Screening: at age 49, no fhx prostate cancer HIV Screening: 2015 STI Screening: 2019 Seasonal Influenza Vaccination: declines Td/Tdap Vaccination: 2019 Pneumococcal Vaccination: at age 61 Zoster Vaccination: at age 37 Frequency of Dental evaluation: Q3 months due to gum disease Frequency of Eye evaluation: yearly, wears eyeglasses Exercise: exercises regularly Diet: regular  Reports that depression is well controlled on current dose of sertraline, going to counseling, does not wish to make any changes in dose, does not need refill  Reports anal fissure has been causing issues again - has been seen by gen surg  Depression screen [facility] 2/9 04/02/2019 03/26/2019 01/24/2019  Decreased Interest - 0 0  Down, Depressed, Hopeless 0 0 0  PHQ - 2 Score 0 0 0  Altered sleeping - - 0  Tired, decreased energy - - 0  Change in appetite - - 0  Feeling bad or failure about yourself  - - 0  Trouble concentrating - - 0  Moving slowly or fidgety/restless - - 0  Suicidal thoughts - - 0  PHQ-9 Score - - 0  Difficult doing work/chores - - Not difficult at all    Fall Risk  04/02/2019 03/26/2019 01/03/2019 07/23/2018 02/28/2018  Falls in the past year? 0 0 0 0 Yes  Number falls in past yr: 0 0 0 - -  Injury with Fall? 0 0 0 - -  Follow up - - - Falls evaluation completed -     No Known Allergies  Prior to Admission medications   Medication Sig Start Date End Date Taking? Authorizing Provider  sertraline (ZOLOFT) 50 MG tablet Take 1 tablet (50 mg total) by mouth daily. 07/23/18  Yes Rutherford Guys, MD    Past Medical History:  Diagnosis Date  . Allergy    seasonal  . Anal fissure   . Anxiety   . Blood  transfusion without reported diagnosis    as a child  . Depression   . Seizures ([place])    last seizure age 7.    Past Surgical History:  Procedure Laterality Date  . MOUTH SURGERY      Social History   Tobacco Use  . Smoking status: Never Smoker  . Smokeless tobacco: Never Used  Substance Use Topics  . Alcohol use: Yes    Alcohol/week: 0.0 standard drinks    Comment: occasional    Family History  Problem Relation Age of Onset  . Anxiety disorder Father     Review of Systems  Constitutional: Negative for chills and fever.  Respiratory: Negative for cough and shortness of breath.   Cardiovascular: Negative for chest pain, palpitations and leg swelling.  Gastrointestinal: Negative for abdominal pain, nausea and vomiting.  All other systems reviewed and are negative.    OBJECTIVE:  Today's Vitals   04/02/19 0917  BP: 110/73  Pulse: 97  Temp: 98.7 F (37.1 C)  SpO2: 97%  Weight: 191 lb (86.6 kg)  Height: 6\' 1"  (1.854 m)   Body mass index is 25.2 kg/m.  Wt Readings from Last 3 Encounters:  04/02/19 191 lb (86.6 kg)  03/26/19 192 lb 9.6 oz (87.4 kg)  01/24/19 190 lb (86.2 kg)  Physical Exam Vitals signs and nursing note reviewed.  Constitutional:      Appearance: He is well-developed.  HENT:     Head: Normocephalic and atraumatic.     Right Ear: Hearing, tympanic membrane, ear canal and external ear normal.     Left Ear: Hearing, tympanic membrane, ear canal and external ear normal.     Mouth/Throat:     Pharynx: No oropharyngeal exudate.  Eyes:     Conjunctiva/sclera: Conjunctivae normal.     Pupils: Pupils are equal, round, and reactive to light.  Neck:     Musculoskeletal: Neck supple.     Thyroid: No thyromegaly.  Cardiovascular:     Rate and Rhythm: Normal rate and regular rhythm.     Heart sounds: Normal heart sounds. No murmur. No friction rub. No gallop.   Pulmonary:     Effort: Pulmonary effort is normal.     Breath sounds: Normal  breath sounds. No wheezing, rhonchi or rales.  Abdominal:     General: Bowel sounds are normal. There is no distension.     Palpations: Abdomen is soft. There is no mass.     Tenderness: There is no abdominal tenderness.  Musculoskeletal: Normal range of motion.     Right lower leg: No edema.     Left lower leg: No edema.  Lymphadenopathy:     Cervical: No cervical adenopathy.  Skin:    General: Skin is warm and dry.  Neurological:     Mental Status: He is alert and oriented to person, place, and time.     Cranial Nerves: No cranial nerve deficit.     Coordination: Coordination normal.     Gait: Gait normal.     Deep Tendon Reflexes: Reflexes are normal and symmetric.     No results found for this or any previous visit (from the past 24 hour(s)).  No results found.   ASSESSMENT and PLAN  1. Annual physical exam Routine HCM labs ordered. HCM reviewed/discussed. Anticipatory guidance regarding healthy weight, lifestyle and choices given.   2. Screen for STD (sexually transmitted disease) - HIV antibody - RPR - Urine cytology ancillary only  3. Screening for lipid disorders - Lipid panel  4. Major depressive disorder with single episode, in full remission (HCC) Controlled. Continue current regime.   5. Anal fissure Advised patient schedule appt with gen surg  Return for as scheduled.    Myles Lipps, MD Primary Care at [facility] [address] [place], [place] [address] Ph.  [phone] Fax [phone]

## 2019-04-02 NOTE — Patient Instructions (Addendum)
   If you have lab work done today you will be contacted with your lab results within the next 2 weeks.  If you have not heard from us then please contact us. The fastest way to get your results is to register for My Chart.   IF you received an x-ray today, you will receive an invoice from [facility]. Please contact [facility] at [phone] with questions or concerns regarding your invoice.   IF you received labwork today, you will receive an invoice from [facility]. Please contact [facility] at [phone] with questions or concerns regarding your invoice.   Our billing staff will not be able to assist you with questions regarding bills from these companies.  You will be contacted with the lab results as soon as they are available. The fastest way to get your results is to activate your My Chart account. Instructions are located on the last page of this paperwork. If you have not heard from us regarding the results in 2 weeks, please contact this office.     Preventive Care 31-39 Years Old, Male Preventive care refers to lifestyle choices and visits with your health care provider that can promote health and wellness. This includes:  A yearly physical exam. This is also called an annual well check.  Regular dental and eye exams.  Immunizations.  Screening for certain conditions.  Healthy lifestyle choices, such as eating a healthy diet, getting regular exercise, not using drugs or products that contain nicotine and tobacco, and limiting alcohol use. What can I expect for my preventive care visit? Physical exam Your health care provider will check:  Height and weight. These may be used to calculate body mass index (BMI), which is a measurement that tells if you are at a healthy weight.  Heart rate and blood pressure.  Your skin for abnormal spots. Counseling Your health care provider may ask you questions about:  Alcohol, tobacco, and drug use.  Emotional  well-being.  Home and relationship well-being.  Sexual activity.  Eating habits.  Work and work environment. What immunizations do I need?  Influenza (flu) vaccine  This is recommended every year. Tetanus, diphtheria, and pertussis (Tdap) vaccine  You may need a Td booster every 10 years. Varicella (chickenpox) vaccine  You may need this vaccine if you have not already been vaccinated. Human papillomavirus (HPV) vaccine  If recommended by your health care provider, you may need three doses over 6 months. Measles, mumps, and rubella (MMR) vaccine  You may need at least one dose of MMR. You may also need a second dose. Meningococcal conjugate (MenACWY) vaccine  One dose is recommended if you are 19-21 years old and a first-year college student living in a residence hall, or if you have one of several medical conditions. You may also need additional booster doses. Pneumococcal conjugate (PCV13) vaccine  You may need this if you have certain conditions and were not previously vaccinated. Pneumococcal polysaccharide (PPSV23) vaccine  You may need one or two doses if you smoke cigarettes or if you have certain conditions. Hepatitis A vaccine  You may need this if you have certain conditions or if you travel or work in places where you may be exposed to hepatitis A. Hepatitis B vaccine  You may need this if you have certain conditions or if you travel or work in places where you may be exposed to hepatitis B. Haemophilus influenzae type b (Hib) vaccine  You may need this if you have certain risk   factors. You may receive vaccines as individual doses or as more than one vaccine together in one shot (combination vaccines). Talk with your health care provider about the risks and benefits of combination vaccines. What tests do I need? Blood tests  Lipid and cholesterol levels. These may be checked every 5 years starting at age 20.  Hepatitis C test.  Hepatitis B  test. Screening   Diabetes screening. This is done by checking your blood sugar (glucose) after you have not eaten for a while (fasting).  Sexually transmitted disease (STD) testing. Talk with your health care provider about your test results, treatment options, and if necessary, the need for more tests. Follow these instructions at home: Eating and drinking   Eat a diet that includes fresh fruits and vegetables, whole grains, lean protein, and low-fat dairy products.  Take vitamin and mineral supplements as recommended by your health care provider.  Do not drink alcohol if your health care provider tells you not to drink.  If you drink alcohol: ? Limit how much you have to 0-2 drinks a day. ? Be aware of how much alcohol is in your drink. In the [place] one drink equals one 12 oz bottle of beer (355 mL), one 5 oz glass of wine (148 mL), or one 1 oz glass of hard liquor (44 mL). Lifestyle  Take daily care of your teeth and gums.  Stay active. Exercise for at least 30 minutes on 5 or more days each week.  Do not use any products that contain nicotine or tobacco, such as cigarettes, e-cigarettes, and chewing tobacco. If you need help quitting, ask your health care provider.  If you are sexually active, practice safe sex. Use a condom or other form of protection to prevent STIs (sexually transmitted infections). What's next?  Go to your health care provider once a year for a well check visit.  Ask your health care provider how often you should have your eyes and teeth checked.  Stay up to date on all vaccines. This information is not intended to replace advice given to you by your health care provider. Make sure you discuss any questions you have with your health care provider. Document Released: 06/21/2001 Document Revised: 04/19/2018 Document Reviewed: 04/19/2018 Elsevier Patient Education  2020 Elsevier Inc.  

## 2019-04-03 LAB — URINE CYTOLOGY ANCILLARY ONLY
Chlamydia: NEGATIVE
Comment: NEGATIVE
Comment: NEGATIVE
Comment: NORMAL
Neisseria Gonorrhea: NEGATIVE
Trichomonas: NEGATIVE

## 2019-04-03 LAB — LIPID PANEL
Chol/HDL Ratio: 6.5 ratio — ABNORMAL HIGH (ref 0.0–5.0)
Cholesterol, Total: 275 mg/dL — ABNORMAL HIGH (ref 100–199)
HDL: 42 mg/dL (ref >39)
LDL Chol Calc (NIH): 215 mg/dL — ABNORMAL HIGH (ref 0–99)
Triglycerides: 104 mg/dL (ref 0–149)
VLDL Cholesterol Cal: 18 mg/dL (ref 5–40)

## 2019-04-03 LAB — RPR: RPR Ser Ql: NONREACTIVE

## 2019-04-03 LAB — HIV ANTIBODY (ROUTINE TESTING W REFLEX): HIV Screen 4th Generation wRfx: NONREACTIVE

## 2019-04-08 MED ORDER — ROSUVASTATIN CALCIUM 10 MG PO TABS: 10.0000 mg | ORAL_TABLET | Freq: Every day | ORAL | 1 refills | Status: DC

## 2019-04-08 NOTE — Addendum Note (Signed)
Addended by: Rutherford Guys on: 04/08/2019 12:43 PM   Modules accepted: Orders

## 2019-04-10 ENCOUNTER — Ambulatory Visit: Payer: BC Managed Care – PPO | Admitting: Neurology

## 2019-04-10 ENCOUNTER — Encounter: Payer: Self-pay | Admitting: Neurology

## 2019-04-10 ENCOUNTER — Other Ambulatory Visit: Payer: Self-pay

## 2019-04-10 VITALS — BP 128/74 | HR 87 | Temp 97.5°F | Ht 73.0 in | Wt 191.6 lb

## 2019-04-10 DIAGNOSIS — G4719 Other hypersomnia: Secondary | ICD-10-CM

## 2019-04-10 DIAGNOSIS — E663 Overweight: Secondary | ICD-10-CM | POA: Diagnosis not present

## 2019-04-10 DIAGNOSIS — R0683 Snoring: Secondary | ICD-10-CM | POA: Diagnosis not present

## 2019-04-10 DIAGNOSIS — R0681 Apnea, not elsewhere classified: Secondary | ICD-10-CM

## 2019-04-10 NOTE — Patient Instructions (Signed)

## 2019-04-10 NOTE — Progress Notes (Signed)
Subjective:    Patient ID: Ralph Snow is a 33 y.o. male.  HPI     Ralph Foley, MD, PhD [facility] [address] [address] [place], [place] [address]  Dear Dr. Leretha Pol, I saw your patient, Ralph Snow, upon your kind request in my sleep clinic today for initial consultation of his sleep disorder, in particular, concern for underlying obstructive sleep apnea.  The patient is unaccompanied today.  As you know, Ralph Snow is a 33 year old right-handed gentleman with an underlying medical history of allergies, anxiety, depression, Hyperlipidemia, history of seizure in childhood, and borderline overweight state, who reports snoring and excessive daytime somnolence as well as witnessed apneas per wife's report.  I reviewed your office note from 03/26/2019.  His Epworth sleepiness score is 14 out of 24.  He does not always wake up rested.  He denies night to night nocturia, or recurrent morning headaches.  He suspects that maybe his father has sleep apnea.  He goes to bed between 10 and 11 and rise time is between 730 and 8 AM.  He works in transportation.  He has tried over-the-counter Breathe Right strips for snoring and also a mouthpiece over-the-counter which were not fully effective or at least not consistently helpful.  He is a non-smoker, he drinks alcohol very occasionally in the form of beer, caffeine not on a day-to-day basis.  He denies any telltale symptoms of restless leg syndrome but can be a restless sleeper.  He had recent blood work through your office on 04/02/2019 and was given a prescription for Crestor but has decided not to start it yet.  His cholesterol was 275, LDL elevated at 215.   His Past Medical History Is Significant For: Past Medical History:  Diagnosis Date  . Allergy    seasonal  . Anal fissure   . Anxiety   . Blood transfusion without reported diagnosis    as a child  . Depression   . Seizures (HCC)    last seizure age 33.     His Past Surgical History Is Significant For: Past Surgical History:  Procedure Laterality Date  . MOUTH SURGERY     wisdom teeth extraction    His Family History Is Significant For: Family History  Problem Relation Age of Onset  . Graves' disease Mother   . Lupus Mother   . Anxiety disorder Father   . Healthy Father   . Healthy Sister   . Healthy Brother   . Healthy Maternal Grandmother   . Cancer Maternal Grandfather   . Healthy Brother   . Healthy Sister     His Social History Is Significant For: Social History   Socioeconomic History  . Marital status: Married    Spouse name: Linford Quintela  . Number of children: 0  . Years of education: College  . Highest education level: Not on file  Occupational History  . Occupation: Therapist, sports: [employer]    Comment: [employer]  Social Needs  . Financial resource strain: Not on file  . Food insecurity    Worry: Not on file    Inability: Not on file  . Transportation needs    Medical: Not on file    Non-medical: Not on file  Tobacco Use  . Smoking status: Never Smoker  . Smokeless tobacco: Never Used  Substance and Sexual Activity  . Alcohol use: Yes    Alcohol/week: 0.0 standard drinks    Comment:  occasional  . Drug use: No  . Sexual activity: Yes    Partners: Female  Lifestyle  . Physical activity    Days per week: Not on file    Minutes per session: Not on file  . Stress: Not on file  Relationships  . Social Herbalist on phone: Not on file    Gets together: Not on file    Attends religious service: Not on file    Active member of club or organization: Not on file    Attends meetings of clubs or organizations: Not on file    Relationship status: Not on file  Other Topics Concern  . Not on file  Social History Narrative   Lives with his wife in home.  Caffeine soda few in a month.  Coffee starbucks twice a week.  Education: college grad.  Transportation- employed .     His Allergies Are:  No Known Allergies:   His Current Medications Are:  Outpatient Encounter Medications as of 04/10/2019  Medication Sig  . sertraline (ZOLOFT) 50 MG tablet Take 1 tablet (50 mg total) by mouth daily.  . [DISCONTINUED] rosuvastatin (CRESTOR) 10 MG tablet Take 1 tablet (10 mg total) by mouth daily.   No facility-administered encounter medications on file as of 04/10/2019.   :  Review of Systems:  Out of a complete 14 point review of systems, all are reviewed and negative with the exception of these symptoms as listed below: Review of Systems  Neurological:       Rm 2, alone. Snoring for last several yrs.  Apnea witness by wife.  No sleep study done per pt.     Objective:  Neurological Exam  Physical Exam Physical Examination:   Vitals:   04/10/19 1114  BP: 128/74  Pulse: 87  Temp: (!) 97.5 F (36.4 C)    General Examination: The patient is a very pleasant 33 y.o. male in no acute distress. He appears well-developed and well-nourished and well groomed.   HEENT: Normocephalic, atraumatic, pupils are equal, round and reactive to light, extraocular tracking is good without limitation to gaze excursion or nystagmus noted. Hearing is grossly intact. Face is symmetric with normal facial animation. Speech is clear with no dysarthria noted. There is no hypophonia. There is no lip, neck/head, jaw or voice tremor. Neck is supple with full range of passive and active motion. There are no carotid bruits on auscultation. Oropharynx exam reveals: mild mouth dryness, good dental hygiene and moderate airway crowding, due to Small airway entry, tonsillar size of 1-2+, wider tongue, tongue protrudes centrally in palate elevates symmetrically, neck circumference is 16 inches, he has a mild to moderate overbite.   Chest: Clear to auscultation without wheezing, rhonchi or crackles noted.  Heart: S1+S2+0, regular and normal without murmurs, rubs or gallops noted.   Abdomen:  Soft, non-tender and non-distended with normal bowel sounds appreciated on auscultation.  Extremities: There is no pitting edema in the distal lower extremities bilaterally.   Skin: Warm and dry without trophic changes noted.   Musculoskeletal: exam reveals no obvious joint deformities, tenderness or joint swelling or erythema.   Neurologically:  Mental status: The patient is awake, alert and oriented in all 4 spheres. His immediate and remote memory, attention, language skills and fund of knowledge are appropriate. There is no evidence of aphasia, agnosia, apraxia or anomia. Speech is clear with normal prosody and enunciation. Thought process is linear. Mood is normal and affect is normal.  Cranial  nerves II - XII are as described above under HEENT exam.  Motor exam: Normal bulk, strength and tone is noted. There is no tremor, Romberg is negative. Fine motor skills and coordination: grossly intact.  Cerebellar testing: No dysmetria or intention tremor. There is no truncal or gait ataxia.  Sensory exam: intact to light touch in the upper and lower extremities.  Gait, station and balance: He stands easily. No veering to one side is noted. No leaning to one side is noted. Posture is age-appropriate and stance is narrow based. Gait shows normal stride length and normal pace. No problems turning are noted. Tandem walk is unremarkable.                Assessment and Plan:  In summary, Melburn Hakearon A Fryer is a very pleasant 33 y.o.-year old male an underlying medical history of allergies, anxiety, depression, Hyperlipidemia, history of seizure in childhood, and borderline overweight state, whose history and physical exam are concerning for obstructive sleep apnea (OSA). I had a long chat with the patient about my findings and the diagnosis of OSA, its prognosis and treatment options. We talked about medical treatments, surgical interventions and non-pharmacological approaches. I explained in particular the risks  and ramifications of untreated moderate to severe OSA, especially with respect to developing cardiovascular disease down the Road, including congestive heart failure, difficult to treat hypertension, cardiac arrhythmias, or stroke. Even type 2 diabetes has, in part, been linked to untreated OSA. Symptoms of untreated OSA include daytime sleepiness, memory problems, mood irritability and mood disorder such as depression and anxiety, lack of energy, as well as recurrent headaches, especially morning headaches. We talked about trying to maintain a healthy lifestyle in general, as well as the importance of weight control. We also talked about the importance of good sleep hygiene. I recommended the following at this time: sleep study.   I explained the sleep test procedure to the patient and also outlined possible surgical and non-surgical treatment options of OSA, including the use of a custom-made dental device (which would require a referral to a specialist dentist or oral surgeon), upper airway surgical options, such as traditional UPPP or a novel less invasive surgical option in the form of Inspire hypoglossal nerve stimulation (which would involve a referral to an ENT surgeon). I also explained the CPAP treatment option to the patient, who indicated that he would be willing to try CPAP if the need arises. I explained the importance of being compliant with PAP treatment, not only for insurance purposes but primarily to improve His symptoms, and for the patient's long term health benefit, including to reduce His cardiovascular risks. I answered all his questions today and the patient was in agreement. I plan to see him back after the sleep study is completed and encouraged him to call with any interim questions, concerns, problems or updates.   Thank you very much for allowing me to participate in the care of this nice patient. If I can be of any further assistance to you please do not hesitate to call me at  [phone].  Sincerely,   Ralph FoleySaima Corsica Franson, MD, PhD

## 2019-05-15 ENCOUNTER — Ambulatory Visit (INDEPENDENT_AMBULATORY_CARE_PROVIDER_SITE_OTHER): Payer: BC Managed Care – PPO | Admitting: Neurology

## 2019-05-15 ENCOUNTER — Other Ambulatory Visit: Payer: Self-pay

## 2019-05-15 DIAGNOSIS — G4710 Hypersomnia, unspecified: Secondary | ICD-10-CM | POA: Diagnosis not present

## 2019-05-15 DIAGNOSIS — G4720 Circadian rhythm sleep disorder, unspecified type: Secondary | ICD-10-CM

## 2019-05-15 DIAGNOSIS — G4719 Other hypersomnia: Secondary | ICD-10-CM

## 2019-05-15 DIAGNOSIS — R0681 Apnea, not elsewhere classified: Secondary | ICD-10-CM

## 2019-05-15 DIAGNOSIS — E663 Overweight: Secondary | ICD-10-CM

## 2019-05-15 DIAGNOSIS — R0683 Snoring: Secondary | ICD-10-CM

## 2019-05-23 NOTE — Progress Notes (Signed)
Patient referred by Dr. Leretha Pol, seen by me on 04/10/19, diagnostic PSG on 05/15/19.   Please call and notify the patient that the recent sleep study did not show any significant obstructive sleep apnea with the exception of mild to moderate snoring and borderline REM related OSA. CPAP therapy is not warranted; even though he is not obese, some weight loss and avoidance of the supine sleep position may alleviate his snoring and REM related OSA. He can FU with PCP. Huston Foley, MD, PhD [facility])

## 2019-05-23 NOTE — Procedures (Signed)
PATIENT'S NAME:  Ralph Snow, Ralph Snow DOB:      1985-08-23      MR#:    [id number]     DATE OF RECORDING: 05/15/2019 REFERRING M.D.:  Koren Shiver, MD Study Performed:   Baseline Polysomnogram HISTORY: 34 year old man with a history of allergies, anxiety, depression, Hyperlipidemia, history of seizure in childhood, and borderline overweight state, who reports snoring and excessive daytime somnolence as well as witnessed apneas per wife's report. The patient endorsed the Epworth Sleepiness Scale at 14 points. The patient's weight 191 pounds with a height of 73 (inches), resulting in a BMI of 25.4 kg/m2. The patient's neck circumference measured 16 inches.  CURRENT MEDICATIONS: Zoloft   PROCEDURE:  This is a multichannel digital polysomnogram utilizing the Somnostar 11.2 system.  Electrodes and sensors were applied and monitored per AASM Specifications.   EEG, EOG, Chin and Limb EMG, were sampled at 200 Hz.  ECG, Snore and Nasal Pressure, Thermal Airflow, Respiratory Effort, CPAP Flow and Pressure, Oximetry was sampled at 50 Hz. Digital video and audio were recorded.      BASELINE STUDY  Lights Out was at 21:59 and Lights On at 05:04.  Total recording time (TRT) was 426 minutes, with a total sleep time (TST) of 375.5 minutes.   The patient's sleep latency was 23 minutes. REM latency was 126.5 minutes, which is mildly delayed. The sleep efficiency was 88.1%.     SLEEP ARCHITECTURE: WASO (Wake after sleep onset) was 40.5 minutes.  There were 19.5 minutes in Stage N1, 273 minutes Stage N2, 13 minutes Stage N3 and 70 minutes in Stage REM.  The percentage of Stage N1 was 5.2%, Stage N2 was 72.7%, Stage N3 was 3.5% and Stage R (REM sleep) was 18.6%.  The arousals were noted as: 120 were spontaneous, 0 were associated with PLMs, 11 were associated with respiratory events.  RESPIRATORY ANALYSIS:  There were a total of 27 respiratory events:  9 obstructive apneas, 1 central apneas and 2 mixed apneas with a total of  12 apneas and an apnea index (AI) of 1.9 /hour. There were 15 hypopneas with a hypopnea index of 2.4 /hour. The patient also had 0 respiratory event related arousals (RERAs).      The total APNEA/HYPOPNEA INDEX (AHI) was 4.3/hour and the total RESPIRATORY DISTURBANCE INDEX was  4.3 /hour.  8 events occurred in REM sleep and 22 events in NREM. The REM AHI was  6.9 /hour, versus a non-REM AHI of 3.7. The patient spent 327 minutes of total sleep time in the supine position and 49 minutes in non-supine.. The supine AHI was 3.9 versus a non-supine AHI of 7.4.  OXYGEN SATURATION & C02:  The Wake baseline 02 saturation was 96%, with the lowest being 89%. Time spent below 89% saturation equaled 0 minutes.  PERIODIC LIMB MOVEMENTS: The patient had a total of 0 Periodic Limb Movements.  The Periodic Limb Movement (PLM) index was 0 and the PLM Arousal index was 0/hour.  Audio and video analysis did not show any abnormal or unusual movements, behaviors, phonations or vocalizations. The patient took no bathroom breaks. Mild to moderate snoring was noted. The EKG was in keeping with normal sinus rhythm (NSR).  Post-study, the patient indicated that sleep was better than usual.   IMPRESSION:  1. Primary Snoring 2. Dysfunctions associated with sleep stages or arousal from sleep  RECOMMENDATIONS:  1. This study does not demonstrate any significant obstructive or central sleep disordered breathing with the exception of mild to moderate  snoring and borderline REM related OSA. CPAP therapy is not warranted; weight loss and avoidance of the supine sleep position may alleviate his snoring and REM related OSA.  2. This study shows sleep fragmentation and abnormal sleep stage percentages; these are nonspecific findings and per se do not signify an intrinsic sleep disorder or a cause for the patient's sleep-related symptoms. Causes include (but are not limited to) the first night effect of the sleep study, circadian  rhythm disturbances, medication effect or an underlying mood disorder or medical problem.  3. The patient should be cautioned not to drive, work at heights, or operate dangerous or heavy equipment when tired or sleepy. Review and reiteration of good sleep hygiene measures should be pursued with any patient. 4. The patient will be advised to follow up with the referring provider, who will be notified of the test results.  I certify that I have reviewed the entire raw data recording prior to the issuance of this report in accordance with the Standards of Accreditation of the American Academy of Sleep Medicine (AASM)  Star Age, MD, PhD Diplomat, American Board of Neurology and Sleep Medicine (Neurology and Sleep Medicine)

## 2019-05-27 ENCOUNTER — Telehealth: Payer: Self-pay

## 2019-05-27 NOTE — Telephone Encounter (Signed)
-----   Message from Huston Foley, MD sent at 05/23/2019  6:43 PM EST ----- Patient referred by Dr. Leretha Pol, seen by me on 04/10/19, diagnostic PSG on 05/15/19.   Please call and notify the patient that the recent sleep study did not show any significant obstructive sleep apnea with the exception of mild to moderate snoring and borderline REM related OSA. CPAP therapy is not warranted; even though he is not obese, some weight loss and avoidance of the supine sleep position may alleviate his snoring and REM related OSA. He can FU with PCP. Huston Foley, MD, PhD [facility])

## 2019-05-27 NOTE — Telephone Encounter (Signed)
I reached out to the pt and advised of results. PT verbalized understanding and had no question at this time for me.

## 2019-05-30 ENCOUNTER — Encounter: Payer: Self-pay | Admitting: Family Medicine

## 2019-05-30 ENCOUNTER — Ambulatory Visit: Payer: BC Managed Care – PPO | Admitting: Family Medicine

## 2019-05-30 ENCOUNTER — Other Ambulatory Visit: Payer: Self-pay

## 2019-05-30 VITALS — BP 122/75 | HR 102 | Temp 98.7°F | Resp 17 | Ht 73.0 in | Wt 188.0 lb

## 2019-05-30 DIAGNOSIS — R0683 Snoring: Secondary | ICD-10-CM | POA: Diagnosis not present

## 2019-05-30 NOTE — Progress Notes (Signed)
1/21/202111:53 AM  Ralph Snow 1985/06/26, 34 y.o., male [id number]  Chief Complaint  Patient presents with  . Follow-up    patient snores and want to know how to stop it, sleep apnea test was good     HPI:   Patient is a 34 y.o. male who presents today for followup after sleep study  Sleep study done 05/15/2019 Sleep study did not show any significant obstructive sleep apnea with the exception of mild to moderate snoring and borderline REM related OSA. CPAP therapy is not warranted; even though he is not obese, some weight loss and avoidance of the supine sleep position may alleviate his snoring and REM related OSA. He can FU with PCP. Star Age, MD, PhD [facility])  Depression screen [facility] 2/9 04/02/2019 03/26/2019 01/24/2019  Decreased Interest - 0 0  Down, Depressed, Hopeless 0 0 0  PHQ - 2 Score 0 0 0  Altered sleeping - - 0  Tired, decreased energy - - 0  Change in appetite - - 0  Feeling bad or failure about yourself  - - 0  Trouble concentrating - - 0  Moving slowly or fidgety/restless - - 0  Suicidal thoughts - - 0  PHQ-9 Score - - 0  Difficult doing work/chores - - Not difficult at all    Fall Risk  04/02/2019 03/26/2019 01/03/2019 07/23/2018 02/28/2018  Falls in the past year? 0 0 0 0 Yes  Number falls in past yr: 0 0 0 - -  Injury with Fall? 0 0 0 - -  Follow up - - - Falls evaluation completed -     No Known Allergies  Prior to Admission medications   Medication Sig Start Date End Date Taking? Authorizing Provider  sertraline (ZOLOFT) 50 MG tablet Take 1 tablet (50 mg total) by mouth daily. 07/23/18  Yes Rutherford Guys, MD    Past Medical History:  Diagnosis Date  . Allergy    seasonal  . Anal fissure   . Anxiety   . Blood transfusion without reported diagnosis    as a child  . Depression   . Seizures ([place])    last seizure age 81.    Past Surgical History:  Procedure Laterality Date  . MOUTH SURGERY     wisdom  teeth extraction    Social History   Tobacco Use  . Smoking status: Never Smoker  . Smokeless tobacco: Never Used  Substance Use Topics  . Alcohol use: Yes    Alcohol/week: 0.0 standard drinks    Comment: occasional    Family History  Problem Relation Age of Onset  . Graves' disease Mother   . Lupus Mother   . Anxiety disorder Father   . Healthy Father   . Healthy Sister   . Healthy Brother   . Healthy Maternal Grandmother   . Cancer Maternal Grandfather   . Healthy Brother   . Healthy Sister     ROS Per hpi  OBJECTIVE:  Today's Vitals   05/30/19 1125  BP: 122/75  Pulse: (!) 102  Resp: 17  Temp: 98.7 F (37.1 C)  TempSrc: Oral  SpO2: 98%  Weight: 188 lb (85.3 kg)  Height: 6\' 1"  (1.854 m)   Body mass index is 24.8 kg/m.   Physical Exam Vitals and nursing note reviewed.  Constitutional:      Appearance: He is well-developed.  HENT:     Head: Normocephalic and atraumatic.  Eyes:     Conjunctiva/sclera:  Conjunctivae normal.     Pupils: Pupils are equal, round, and reactive to light.  Pulmonary:     Effort: Pulmonary effort is normal.  Musculoskeletal:     Cervical back: Neck supple.  Skin:    General: Skin is warm and dry.  Neurological:     Mental Status: He is alert and oriented to person, place, and time.     No results found for this or any previous visit (from the past 24 hour(s)).  No results found.   ASSESSMENT and PLAN  1. Snoring Discussed conservative measures as not sleeping supine, managing nasal congestion with flonase, use of mouth piece.  Patient educational handout given.  Return if symptoms worsen or fail to improve.      Myles Lipps, MD Primary Care at [facility] [address] [place], [place] [address] Ph.  [phone] Fax [phone]

## 2019-05-30 NOTE — Patient Instructions (Signed)
° ° ° °  If you have lab work done today you will be contacted with your lab results within the next 2 weeks.  If you have not heard from us then please contact us. The fastest way to get your results is to register for My Chart. ° ° °IF you received an x-ray today, you will receive an invoice from [facility]. Please contact [facility] at [phone] with questions or concerns regarding your invoice.  ° °IF you received labwork today, you will receive an invoice from [facility]. Please contact [facility] at [phone] with questions or concerns regarding your invoice.  ° °Our billing staff will not be able to assist you with questions regarding bills from these companies. ° °You will be contacted with the lab results as soon as they are available. The fastest way to get your results is to activate your My Chart account. Instructions are located on the last page of this paperwork. If you have not heard from us regarding the results in 2 weeks, please contact this office. °  ° ° ° °

## 2019-06-17 ENCOUNTER — Other Ambulatory Visit: Payer: Self-pay | Admitting: Family Medicine

## 2019-06-17 NOTE — Telephone Encounter (Signed)
Requested Prescriptions  Pending Prescriptions Disp Refills  . sertraline (ZOLOFT) 50 MG tablet [Pharmacy Med Name: SERTRALINE HCL 50 MG TABLET] 90 tablet 1    Sig: TAKE 1 TABLET BY MOUTH EVERY DAY     Psychiatry:  Antidepressants - SSRI Passed - 06/17/2019 12:52 PM      Passed - Completed PHQ-2 or PHQ-9 in the last 360 days.      Passed - Valid encounter within last 6 months    Recent Outpatient Visits          2 weeks ago Snoring   [facility], Meda Coffee, MD   2 months ago Annual physical exam   [facility], Meda Coffee, MD   2 months ago Snoring   [facility], Meda Coffee, MD   4 months ago Poison ivy dermatitis   [facility] at [facility], Sandria Bales, MD   5 months ago Anal fissure   [facility], Meda Coffee, MD

## 2019-08-23 ENCOUNTER — Ambulatory Visit: Payer: BC Managed Care – PPO | Admitting: Registered Nurse

## 2019-08-23 ENCOUNTER — Other Ambulatory Visit: Payer: Self-pay

## 2019-08-23 ENCOUNTER — Telehealth: Payer: Self-pay | Admitting: Family Medicine

## 2019-08-23 VITALS — BP 102/69 | HR 91 | Temp 98.8°F | Resp 16 | Ht 73.0 in | Wt 191.4 lb

## 2019-08-23 DIAGNOSIS — Z13220 Encounter for screening for lipoid disorders: Secondary | ICD-10-CM

## 2019-08-23 DIAGNOSIS — J302 Other seasonal allergic rhinitis: Secondary | ICD-10-CM | POA: Diagnosis not present

## 2019-08-23 DIAGNOSIS — R0789 Other chest pain: Secondary | ICD-10-CM | POA: Diagnosis not present

## 2019-08-23 DIAGNOSIS — Z1329 Encounter for screening for other suspected endocrine disorder: Secondary | ICD-10-CM | POA: Diagnosis not present

## 2019-08-23 DIAGNOSIS — Z130 Encounter for screening for diseases of the blood and blood-forming organs and certain disorders involving the immune mechanism: Secondary | ICD-10-CM

## 2019-08-23 DIAGNOSIS — Z13228 Encounter for screening for other metabolic disorders: Secondary | ICD-10-CM

## 2019-08-23 MED ORDER — ALBUTEROL SULFATE HFA 108 (90 BASE) MCG/ACT IN AERS: 2.0000 | INHALATION_SPRAY | Freq: Four times a day (QID) | RESPIRATORY_TRACT | 3 refills | Status: AC | PRN

## 2019-08-23 MED ORDER — OMEPRAZOLE 40 MG PO CPDR: 40.0000 mg | DELAYED_RELEASE_CAPSULE | Freq: Every day | ORAL | 3 refills | Status: DC

## 2019-08-23 MED ORDER — MONTELUKAST SODIUM 10 MG PO TABS: 10.0000 mg | ORAL_TABLET | Freq: Every day | ORAL | 3 refills | Status: AC

## 2019-08-23 NOTE — Patient Instructions (Signed)
° ° ° °  If you have lab work done today you will be contacted with your lab results within the next 2 weeks.  If you have not heard from us then please contact us. The fastest way to get your results is to register for My Chart. ° ° °IF you received an x-ray today, you will receive an invoice from [facility]. Please contact [facility] at [phone] with questions or concerns regarding your invoice.  ° °IF you received labwork today, you will receive an invoice from [facility]. Please contact [facility] at [phone] with questions or concerns regarding your invoice.  ° °Our billing staff will not be able to assist you with questions regarding bills from these companies. ° °You will be contacted with the lab results as soon as they are available. The fastest way to get your results is to activate your My Chart account. Instructions are located on the last page of this paperwork. If you have not heard from us regarding the results in 2 weeks, please contact this office. °  ° ° ° °

## 2019-08-23 NOTE — Telephone Encounter (Signed)
Pt called and is at pharmacy and they haven't received his proscription that was proscribed today on 4/16 appt.  [id number] - [place], [place] - [address]  Pt would like a call when they have been sent into the pharmacy. [phone] Please advise.

## 2019-08-23 NOTE — Telephone Encounter (Signed)
Sent Thanks Rich

## 2019-08-23 NOTE — Progress Notes (Signed)
Acute Office Visit  Subjective:    Patient ID: Ralph Snow, male    DOB: 1986/01/23, 34 y.o.   MRN: [id number]  Chief Complaint  Patient presents with  . Shortness of Breath    pt has been struggling with a sharp pain in the center of his chest when he breathes deeply, there is a general ache that is constant in the same location,     HPI Patient is in today for chest pain and shob Started today after running - he hasn't worked out in a few months Located in the center/lower sternum area of his chest. Dull at baseline, sharp with deep breathing. Deep breathing will reproduce this, otherwise, no triggers Has not tried treatment No history of asthma. Has an uncle who passed in late 50s from MI, grandfather who died of MI/sequela in 45s.  No other respiratory history to his knowledge.   Past Medical History:  Diagnosis Date  . Allergy    seasonal  . Anal fissure   . Anxiety   . Blood transfusion without reported diagnosis    as a child  . Depression   . Seizures ([facility])    last seizure age 72.    Past Surgical History:  Procedure Laterality Date  . MOUTH SURGERY     wisdom teeth extraction    Family History  Problem Relation Age of Onset  . Graves' disease Mother   . Lupus Mother   . Anxiety disorder Father   . Healthy Father   . Healthy Sister   . Healthy Brother   . Healthy Maternal Grandmother   . Cancer Maternal Grandfather   . Healthy Brother   . Healthy Sister     Social History   Socioeconomic History  . Marital status: Married    Spouse name: Jaydenn Boccio  . Number of children: 0  . Years of education: College  . Highest education level: Not on file  Occupational History  . Occupation: Therapist, sports: [employer]    Comment: [employer]  Tobacco Use  . Smoking status: Never Smoker  . Smokeless tobacco: Never Used  Substance and Sexual Activity  . Alcohol use: Yes    Alcohol/week: 0.0 standard drinks    Comment:  occasional  . Drug use: No  . Sexual activity: Yes    Partners: Female  Other Topics Concern  . Not on file  Social History Narrative   Lives with his wife in home.  Caffeine soda few in a month.  Coffee starbucks twice a week.  Education: college grad.  Transportation- employed .   Social Determinants of Health   Financial Resource Strain:   . Difficulty of Paying Living Expenses:   Food Insecurity:   . Worried About Programme researcher, broadcasting/film/video in the Last Year:   . Barista in the Last Year:   Transportation Needs:   . Freight forwarder (Medical):   Marland Kitchen Lack of Transportation (Non-Medical):   Physical Activity:   . Days of Exercise per Week:   . Minutes of Exercise per Session:   Stress:   . Feeling of Stress :   Social Connections:   . Frequency of Communication with Friends and Family:   . Frequency of Social Gatherings with Friends and Family:   . Attends Religious Services:   . Active Member of Clubs or Organizations:   . Attends Banker Meetings:   Marland Kitchen Marital Status:   Intimate  Partner Violence:   . Fear of Current or Ex-Partner:   . Emotionally Abused:   Marland Kitchen Physically Abused:   . Sexually Abused:     Outpatient Medications Prior to Visit  Medication Sig Dispense Refill  . sertraline (ZOLOFT) 50 MG tablet TAKE 1 TABLET BY MOUTH EVERY DAY 90 tablet 1   No facility-administered medications prior to visit.    No Known Allergies  Review of Systems  Constitutional: Negative.   HENT: Negative.   Eyes: Negative.   Respiratory: Positive for chest tightness and shortness of breath. Negative for apnea, cough, choking, wheezing and stridor.   Cardiovascular: Positive for chest pain. Negative for palpitations and leg swelling.  Gastrointestinal: Negative.   Endocrine: Negative.   Genitourinary: Negative.   Musculoskeletal: Negative.   Skin: Negative.   Allergic/Immunologic: Negative.   Neurological: Negative.   Hematological: Negative.     Psychiatric/Behavioral: Negative.   All other systems reviewed and are negative.      Objective:    Physical Exam Vitals and nursing note reviewed.  Constitutional:      General: He is not in acute distress.    Appearance: He is well-developed and normal weight. He is not ill-appearing, toxic-appearing or diaphoretic.  Cardiovascular:     Rate and Rhythm: Normal rate and regular rhythm.     Heart sounds: Normal heart sounds.  Pulmonary:     Effort: Pulmonary effort is normal. No tachypnea, bradypnea or respiratory distress.     Breath sounds: Normal breath sounds. No decreased breath sounds, wheezing, rhonchi or rales.  Skin:    General: Skin is warm and dry.     Capillary Refill: Capillary refill takes less than 2 seconds.     Coloration: Skin is not cyanotic or pale.     Findings: No ecchymosis, erythema or rash.     Nails: There is no clubbing.  Neurological:     General: No focal deficit present.     Mental Status: He is alert and oriented to person, place, and time.     Cranial Nerves: No cranial nerve deficit.     Motor: No weakness.  Psychiatric:        Mood and Affect: Mood is anxious.        Behavior: Behavior normal. Behavior is not agitated.     BP 102/69   Pulse 91   Temp 98.8 F (37.1 C) (Temporal)   Resp 16   Ht 6\' 1"  (1.854 m)   Wt 191 lb 6.4 oz (86.8 kg)   SpO2 97%   BMI 25.25 kg/m  Wt Readings from Last 3 Encounters:  08/23/19 191 lb 6.4 oz (86.8 kg)  05/30/19 188 lb (85.3 kg)  04/10/19 191 lb 9.6 oz (86.9 kg)    There are no preventive care reminders to display for this patient.  There are no preventive care reminders to display for this patient.   Lab Results  Component Value Date   TSH 1.080 12/13/2017   Lab Results  Component Value Date   WBC 6.3 03/26/2019   HGB 14.5 03/26/2019   HCT 43.4 03/26/2019   MCV 92 03/26/2019   PLT 269 03/26/2019   Lab Results  Component Value Date   NA 139 03/26/2019   K 4.4 03/26/2019   CO2 22  03/26/2019   GLUCOSE 78 03/26/2019   BUN 11 03/26/2019   CREATININE 0.75 (L) 03/26/2019   BILITOT 0.3 03/26/2019   ALKPHOS 89 03/26/2019   AST 24 03/26/2019   ALT  19 03/26/2019   PROT 7.0 03/26/2019   ALBUMIN 4.5 03/26/2019   CALCIUM 9.1 03/26/2019   Lab Results  Component Value Date   CHOL 275 (H) 04/02/2019   Lab Results  Component Value Date   HDL 42 04/02/2019   Lab Results  Component Value Date   LDLCALC 215 (H) 04/02/2019   Lab Results  Component Value Date   TRIG 104 04/02/2019   Lab Results  Component Value Date   CHOLHDL 6.5 (H) 04/02/2019   No results found for: HGBA1C     Assessment & Plan:   Problem List Items Addressed This Visit      Other   Seasonal allergies   Relevant Medications   albuterol (VENTOLIN HFA) 108 (90 Base) MCG/ACT inhaler   montelukast (SINGULAIR) 10 MG tablet    Other Visit Diagnoses    Other chest pain    -  Primary   Relevant Medications   omeprazole (PRILOSEC) 40 MG capsule   Other Relevant Orders   EKG 12-Lead (Completed)   CBC With Differential   Comprehensive metabolic panel   TSH   Lipid panel   Screening for endocrine, metabolic and immunity disorder       Relevant Orders   CBC With Differential   Comprehensive metabolic panel   TSH   Lipid screening       Relevant Orders   Lipid panel       No orders of the defined types were placed in this encounter.  PLAN  EKG shows nonspecific T abnormality but morphology indicates like BER, exam does not suggest any further abnormalities  Liekly seasonal allergies vs GERD contributing to his pain given his age and overall health, but will draw further labs to reassure [place]  Patient encouraged to call clinic with any questions, comments, or concerns.  Janeece Agee, NP

## 2019-08-23 NOTE — Telephone Encounter (Signed)
Please Advise

## 2019-08-24 LAB — CBC WITH DIFFERENTIAL
Basophils Absolute: 0 10*3/uL (ref 0.0–0.2)
Basos: 1 %
EOS (ABSOLUTE): 0.1 10*3/uL (ref 0.0–0.4)
Eos: 1 %
Hematocrit: 44.4 % (ref 37.5–51.0)
Hemoglobin: 14.8 g/dL (ref 13.0–17.7)
Immature Grans (Abs): 0 10*3/uL (ref 0.0–0.1)
Immature Granulocytes: 1 %
Lymphocytes Absolute: 2 10*3/uL (ref 0.7–3.1)
Lymphs: 33 %
MCH: 30.6 pg (ref 26.6–33.0)
MCHC: 33.3 g/dL (ref 31.5–35.7)
MCV: 92 fL (ref 79–97)
Monocytes Absolute: 0.8 10*3/uL (ref 0.1–0.9)
Monocytes: 12 %
Neutrophils Absolute: 3.2 10*3/uL (ref 1.4–7.0)
Neutrophils: 52 %
RBC: 4.83 x10E6/uL (ref 4.14–5.80)
RDW: 11.1 % — ABNORMAL LOW (ref 11.6–15.4)
WBC: 6.1 10*3/uL (ref 3.4–10.8)

## 2019-08-24 LAB — COMPREHENSIVE METABOLIC PANEL
ALT: 22 IU/L (ref 0–44)
AST: 30 IU/L (ref 0–40)
Albumin/Globulin Ratio: 1.8 (ref 1.2–2.2)
Albumin: 4.6 g/dL (ref 4.0–5.0)
Alkaline Phosphatase: 84 IU/L (ref 39–117)
BUN/Creatinine Ratio: 22 — ABNORMAL HIGH (ref 9–20)
BUN: 20 mg/dL (ref 6–20)
Bilirubin Total: 0.4 mg/dL (ref 0.0–1.2)
CO2: 22 mmol/L (ref 20–29)
Calcium: 9.9 mg/dL (ref 8.7–10.2)
Chloride: 100 mmol/L (ref 96–106)
Creatinine, Ser: 0.89 mg/dL (ref 0.76–1.27)
GFR calc Af Amer: 132 mL/min/{1.73_m2} (ref >59)
GFR calc non Af Amer: 114 mL/min/{1.73_m2} (ref >59)
Globulin, Total: 2.5 g/dL (ref 1.5–4.5)
Glucose: 90 mg/dL (ref 65–99)
Potassium: 4.6 mmol/L (ref 3.5–5.2)
Sodium: 138 mmol/L (ref 134–144)
Total Protein: 7.1 g/dL (ref 6.0–8.5)

## 2019-08-24 LAB — LIPID PANEL
Chol/HDL Ratio: 6.7 ratio — ABNORMAL HIGH (ref 0.0–5.0)
Cholesterol, Total: 266 mg/dL — ABNORMAL HIGH (ref 100–199)
HDL: 40 mg/dL (ref >39)
LDL Chol Calc (NIH): 180 mg/dL — ABNORMAL HIGH (ref 0–99)
Triglycerides: 240 mg/dL — ABNORMAL HIGH (ref 0–149)
VLDL Cholesterol Cal: 46 mg/dL — ABNORMAL HIGH (ref 5–40)

## 2019-08-24 LAB — TSH: TSH: 0.9 u[IU]/mL (ref 0.450–4.500)

## 2019-09-02 ENCOUNTER — Emergency Department: Payer: BC Managed Care – PPO

## 2019-09-02 ENCOUNTER — Emergency Department
Admission: EM | Admit: 2019-09-02 | Discharge: 2019-09-02 | Disposition: A | Discharge status: Home / Self Care | Payer: BC Managed Care – PPO | Attending: Emergency Medicine | Admitting: Emergency Medicine

## 2019-09-02 ENCOUNTER — Encounter: Payer: Self-pay | Admitting: Registered Nurse

## 2019-09-02 ENCOUNTER — Other Ambulatory Visit: Payer: Self-pay

## 2019-09-02 ENCOUNTER — Encounter: Payer: Self-pay | Admitting: Emergency Medicine

## 2019-09-02 DIAGNOSIS — Y939 Activity, unspecified: Secondary | ICD-10-CM | POA: Insufficient documentation

## 2019-09-02 DIAGNOSIS — Y999 Unspecified external cause status: Secondary | ICD-10-CM | POA: Insufficient documentation

## 2019-09-02 DIAGNOSIS — E782 Mixed hyperlipidemia: Secondary | ICD-10-CM

## 2019-09-02 DIAGNOSIS — Z79899 Other long term (current) drug therapy: Secondary | ICD-10-CM | POA: Insufficient documentation

## 2019-09-02 DIAGNOSIS — S29011A Strain of muscle and tendon of front wall of thorax, initial encounter: Secondary | ICD-10-CM | POA: Insufficient documentation

## 2019-09-02 DIAGNOSIS — E785 Hyperlipidemia, unspecified: Secondary | ICD-10-CM | POA: Diagnosis not present

## 2019-09-02 DIAGNOSIS — Y929 Unspecified place or not applicable: Secondary | ICD-10-CM | POA: Insufficient documentation

## 2019-09-02 DIAGNOSIS — X58XXXA Exposure to other specified factors, initial encounter: Secondary | ICD-10-CM | POA: Diagnosis not present

## 2019-09-02 DIAGNOSIS — S299XXA Unspecified injury of thorax, initial encounter: Secondary | ICD-10-CM | POA: Diagnosis present

## 2019-09-02 HISTORY — DX: Pure hypercholesterolemia, unspecified: E78.00

## 2019-09-02 LAB — CBC
HCT: 45.5 % (ref 39.0–52.0)
Hemoglobin: 15.7 g/dL (ref 13.0–17.0)
MCH: 31.7 pg (ref 26.0–34.0)
MCHC: 34.5 g/dL (ref 30.0–36.0)
MCV: 91.9 fL (ref 80.0–100.0)
Platelets: 266 10*3/uL (ref 150–400)
RBC: 4.95 MIL/uL (ref 4.22–5.81)
RDW: 11.3 % — ABNORMAL LOW (ref 11.5–15.5)
WBC: 5.8 10*3/uL (ref 4.0–10.5)
nRBC: 0 % (ref 0.0–0.2)

## 2019-09-02 LAB — BASIC METABOLIC PANEL
Anion gap: 7 (ref 5–15)
BUN: 20 mg/dL (ref 6–20)
CO2: 26 mmol/L (ref 22–32)
Calcium: 9.3 mg/dL (ref 8.9–10.3)
Chloride: 103 mmol/L (ref 98–111)
Creatinine, Ser: 0.9 mg/dL (ref 0.61–1.24)
GFR calc Af Amer: >60 mL/min (ref >60)
GFR calc non Af Amer: >60 mL/min (ref >60)
Glucose, Bld: 92 mg/dL (ref 70–99)
Potassium: 3.9 mmol/L (ref 3.5–5.1)
Sodium: 136 mmol/L (ref 135–145)

## 2019-09-02 LAB — TROPONIN I (HIGH SENSITIVITY)
Troponin I (High Sensitivity): <2 ng/L (ref <18)
Troponin I (High Sensitivity): <2 ng/L (ref <18)

## 2019-09-02 MED ORDER — ATORVASTATIN CALCIUM 40 MG PO TABS: 40.0000 mg | ORAL_TABLET | Freq: Every day | ORAL | 3 refills | Status: DC

## 2019-09-02 NOTE — ED Provider Notes (Signed)
[facility] Emergency Department Provider Note  ____________________________________________  Time seen: Approximately 6:10 PM  I have reviewed the triage vital signs and the nursing notes.   HISTORY  Chief Complaint Chest Pain    HPI Ralph Snow is a 34 y.o. male with a history of depression, high cholesterol, anxiety who comes ED complaining of left-sided chest pain radiating into the axilla.  No specific aggravating or alleviating factors.  Not exertional nor pleuritic.  No significant shortness of breath diaphoresis or vomiting.  No palpitations or syncope.  Seems to have onset after starting back up with gym workouts including bench presses in the last few weeks.  He also notes that he recently had labs done by primary care and urgent care which showed an elevated lipid panel.  His doctor had prescribed Lipitor, but he has not yet started it and preferred to try diet and exercise first to see if he could control hyperlipidemia without medication.      Past Medical History:  Diagnosis Date  . Allergy    seasonal  . Anal fissure   . Anxiety   . Blood transfusion without reported diagnosis    as a child  . Depression   . High cholesterol   . Seizures ([facility])    last seizure age 34.     Patient Active Problem List   Diagnosis Date Noted  . MDD (major depressive disorder) 04/18/2017  . Seasonal allergies 02/06/2015     Past Surgical History:  Procedure Laterality Date  . MOUTH SURGERY     wisdom teeth extraction     Prior to Admission medications   Medication Sig Start Date End Date Taking? Authorizing Provider  albuterol (VENTOLIN HFA) 108 (90 Base) MCG/ACT inhaler Inhale 2 puffs into the lungs every 6 (six) hours as needed for wheezing or shortness of breath. 08/23/19   Janeece Agee, NP  atorvastatin (LIPITOR) 40 MG tablet Take 1 tablet (40 mg total) by mouth daily. 09/02/19   Janeece Agee, NP  montelukast (SINGULAIR) 10 MG tablet  Take 1 tablet (10 mg total) by mouth at bedtime. 08/23/19   Janeece Agee, NP  omeprazole (PRILOSEC) 40 MG capsule Take 1 capsule (40 mg total) by mouth daily. 08/23/19   Janeece Agee, NP  sertraline (ZOLOFT) 50 MG tablet TAKE 1 TABLET BY MOUTH EVERY DAY 06/17/19   Myles Lipps, MD     Allergies Patient has no known allergies.   Family History  Problem Relation Age of Onset  . Graves' disease Mother   . Lupus Mother   . Anxiety disorder Father   . Healthy Father   . Healthy Sister   . Healthy Brother   . Healthy Maternal Grandmother   . Cancer Maternal Grandfather   . Healthy Brother   . Healthy Sister     Social History Social History   Tobacco Use  . Smoking status: Never Smoker  . Smokeless tobacco: Never Used  Substance Use Topics  . Alcohol use: Yes    Alcohol/week: 0.0 standard drinks    Comment: occasional  . Drug use: No    Review of Systems  Constitutional:   No fever or chills.  ENT:   No sore throat. No rhinorrhea. Cardiovascular:   Positive chest pain as above without syncope. Respiratory:   No dyspnea or cough. Gastrointestinal:   Negative for abdominal pain, vomiting and diarrhea.  Musculoskeletal:   Negative for focal pain or swelling All other systems reviewed and are negative  except as documented above in ROS and HPI.  ____________________________________________   PHYSICAL EXAM:  VITAL SIGNS: ED Triage Vitals  Enc Vitals Group     BP 09/02/19 1322 111/73     Pulse Rate 09/02/19 1322 84     Resp 09/02/19 1322 16     Temp 09/02/19 1322 98.2 F (36.8 C)     Temp Source 09/02/19 1322 Oral     SpO2 09/02/19 1322 98 %     Weight 09/02/19 1323 185 lb (83.9 kg)     Height 09/02/19 1323 6\' 1"  (1.854 m)     Head Circumference --      Peak Flow --      Pain Score 09/02/19 1322 5     Pain Loc --      Pain Edu? --      Excl. in GC? --     Vital signs reviewed, nursing assessments reviewed.   Constitutional:   Alert and oriented.  Non-toxic appearance. Eyes:   Conjunctivae are normal. EOMI. PERRL. ENT      Head:   Normocephalic and atraumatic.      Nose:   Wearing a mask.      Mouth/Throat:   Wearing a mask.      Neck:   No meningismus. Full ROM. Hematological/Lymphatic/Immunilogical:   No cervical lymphadenopathy. Cardiovascular:   RRR. Symmetric bilateral radial and DP pulses.  No murmurs. Cap refill less than 2 seconds. Respiratory:   Normal respiratory effort without tachypnea/retractions. Breath sounds are clear and equal bilaterally. No wheezes/rales/rhonchi. Gastrointestinal:   Soft and nontender. Non distended. There is no CVA tenderness.  No rebound, rigidity, or guarding.  Musculoskeletal:   Normal range of motion in all extremities. No joint effusions.  No lower extremity tenderness.  No edema.  Chest pain is not reproducible on exam, but is reproduced by stress of the pectoralis and teres muscles against resistance Neurologic:   Normal speech and language.  Motor grossly intact. No acute focal neurologic deficits are appreciated.  Skin:    Skin is warm, dry and intact. No rash noted.  No petechiae, purpura, or bullae.  ____________________________________________    LABS (pertinent positives/negatives) (all labs ordered are listed, but only abnormal results are displayed) Labs Reviewed  CBC - Abnormal; Notable for the following components:      Result Value   RDW 11.3 (*)    All other components within normal limits  BASIC METABOLIC PANEL  TROPONIN I (HIGH SENSITIVITY)  TROPONIN I (HIGH SENSITIVITY)   ____________________________________________   EKG  Interpreted by me Normal sinus rhythm rate of 82, normal axis and intervals.  Normal QRS ST segments and T waves.  No evidence of underlying dysrhythmia.  ____________________________________________    RADIOLOGY  DG Chest 2 View  Result Date: 09/02/2019 CLINICAL DATA:  Mid chest pain for 1 week EXAM: CHEST - 2 VIEW COMPARISON:  None.  FINDINGS: The heart size and mediastinal contours are within normal limits. Both lungs are clear. The visualized skeletal structures are unremarkable. IMPRESSION: No active cardiopulmonary disease. Electronically Signed   By: 09/04/2019 M.D.   On: 09/02/2019 13:47    ____________________________________________   PROCEDURES Procedures  ____________________________________________  DIFFERENTIAL DIAGNOSIS   Muscle strain, non-STEMI, GERD, pneumothorax  CLINICAL IMPRESSION / ASSESSMENT AND PLAN / ED COURSE  Medications ordered in the ED: Medications - No data to display  Pertinent labs & imaging results that were available during my care of the patient were reviewed by me and  considered in my medical decision making (see chart for details).  Ralph Snow was evaluated in Emergency Department on 09/02/2019 for the symptoms described in the history of present illness. He was evaluated in the context of the global COVID-19 pandemic, which necessitated consideration that the patient might be at risk for infection with the SARS-CoV-2 virus that causes COVID-19. Institutional protocols and algorithms that pertain to the evaluation of patients at risk for COVID-19 are in a state of rapid change based on information released by regulatory bodies including the CDC and federal and state organizations. These policies and algorithms were followed during the patient's care in the ED.   Patient presents with left-sided chest pain, atypical and noncardiac symptomatology.  EKG and chest x-ray are unremarkable.  Vital signs and physical exam are unremarkable and overall suggestive of chest wall muscle strain.  Labs are all normal including 2 troponins.  Plan to treat with NSAIDs, heat therapy, decreased weight during gym workouts, follow-up with primary care.   Considering the patient's symptoms, medical history, and physical examination today, I have low suspicion for ACS, PE, TAD, pneumothorax,  carditis, mediastinitis, pneumonia, CHF, or sepsis.        ____________________________________________   FINAL CLINICAL IMPRESSION(S) / ED DIAGNOSES    Final diagnoses:  Chest wall muscle strain, initial encounter  Hyperlipidemia, unspecified hyperlipidemia type     ED Discharge Orders    None      Portions of this note were generated with dragon dictation software. Dictation errors may occur despite best attempts at proofreading.   Carrie Mew, MD 09/02/19 [phone]

## 2019-09-02 NOTE — ED Triage Notes (Signed)
First nurse note- here for left side CP.  Pulled and EKG done. Placed in lobby to wait for triage. NAD

## 2019-09-02 NOTE — ED Triage Notes (Addendum)
Patient presents to the ED with chest pain x 1 week.  Patient states 1 week ago he went to urgent care due to sharp pain in his chest.  Patient states in November he was diagnosed with high cholesterol.  Patient states today, when he woke up he had severe left sided chest pain.  Patient states when he looked at his mychart labs, labs looked very abnormal (cholesterol labs) although no one had called patient.  Patient is continuing to have left sided chest pain he desribes as "nagging".  Patient reports some shortness of breath this am, denies any now.

## 2019-12-15 ENCOUNTER — Other Ambulatory Visit: Payer: Self-pay | Admitting: Family Medicine

## 2019-12-15 NOTE — Telephone Encounter (Signed)
Requested Prescriptions  Pending Prescriptions Disp Refills  . sertraline (ZOLOFT) 50 MG tablet [Pharmacy Med Name: SERTRALINE HCL 50 MG TABLET] 90 tablet 0    Sig: TAKE 1 TABLET BY MOUTH EVERY DAY     Psychiatry:  Antidepressants - SSRI Passed - 12/15/2019  9:12 AM      Passed - Completed PHQ-2 or PHQ-9 in the last 360 days.      Passed - Valid encounter within last 6 months    Recent Outpatient Visits          3 months ago Other chest pain   [facility], Gerlene Burdock, NP   6 months ago Snoring   [facility], Meda Coffee, MD   8 months ago Annual physical exam   [facility], Meda Coffee, MD   8 months ago Snoring   [facility], Meda Coffee, MD   10 months ago Poison ivy dermatitis   Primary Care at [facility], Sandria Bales, MD

## 2020-03-25 ENCOUNTER — Other Ambulatory Visit: Payer: Self-pay | Admitting: Registered Nurse

## 2020-03-25 NOTE — Telephone Encounter (Signed)
Requested medication (s) are due for refill today - yes  Requested medication (s) are on the active medication list -yes  Future visit scheduled -yes-change in provider upcoming  Last refill: 3 months ago  Notes to clinic: Patient has NP appointment 1/211/22- can he have RF until then? Requested Prescriptions  Pending Prescriptions Disp Refills   sertraline (ZOLOFT) 50 MG tablet 90 tablet 0    Sig: Take 1 tablet (50 mg total) by mouth daily.      Psychiatry:  Antidepressants - SSRI Failed - 03/25/2020  2:51 PM      Failed - Valid encounter within last 6 months    Recent Outpatient Visits           7 months ago Other chest pain   [facility], Gerlene Burdock, NP   10 months ago Snoring   [facility], Meda Coffee, MD   11 months ago Annual physical exam   [facility], Meda Coffee, MD   1 year ago Snoring   [facility], Meda Coffee, MD   1 year ago Poison ivy dermatitis   [facility] at [facility], Sandria Bales, MD       Future Appointments             In 1 month Hoy Register, MD [facility]            Passed - Completed PHQ-2 or PHQ-9 in the last 360 days          Requested Prescriptions  Pending Prescriptions Disp Refills   sertraline (ZOLOFT) 50 MG tablet 90 tablet 0    Sig: Take 1 tablet (50 mg total) by mouth daily.      Psychiatry:  Antidepressants - SSRI Failed - 03/25/2020  2:51 PM      Failed - Valid encounter within last 6 months    Recent Outpatient Visits           7 months ago Other chest pain   [facility], Gerlene Burdock, NP   10 months ago Snoring   [facility], Meda Coffee, MD   11 months ago Annual physical exam   [facility], Meda Coffee, MD   1 year ago Snoring   [facility], Meda Coffee, MD   1 year ago Poison ivy dermatitis   Primary [facility] at [facility], Sandria Bales, MD       Future  Appointments             In 1 month Hoy Register, MD [facility]            Passed - Completed PHQ-2 or PHQ-9 in the last 360 days

## 2020-03-25 NOTE — Telephone Encounter (Signed)
Copied from CRM [id number]. Topic: Quick Communication - Rx Refill/Question >> Mar 25, 2020  2:47 PM Mcneil, Ja-Kwan wrote: Medication: sertraline (ZOLOFT) 50 MG tablet  Has the patient contacted their pharmacy? no  Preferred Pharmacy (with phone number or street name): [facility] [phone] [facility], [place] - [address]  Phone: [phone]   Fax: [phone]  Agent: Please be advised that RX refills may take up to 3 business days. We ask that you follow-up with your pharmacy.

## 2020-03-26 MED ORDER — SERTRALINE HCL 50 MG PO TABS: 50.0000 mg | ORAL_TABLET | Freq: Every day | ORAL | 0 refills | Status: DC

## 2020-05-19 ENCOUNTER — Telehealth: Payer: Self-pay | Admitting: Family Medicine

## 2020-06-20 ENCOUNTER — Other Ambulatory Visit: Payer: Self-pay | Admitting: Registered Nurse

## 2020-06-27 ENCOUNTER — Other Ambulatory Visit: Payer: Self-pay

## 2020-06-27 ENCOUNTER — Emergency Department: Payer: BC Managed Care – PPO

## 2020-06-27 ENCOUNTER — Emergency Department
Admission: EM | Admit: 2020-06-27 | Discharge: 2020-06-27 | Disposition: A | Discharge status: Home / Self Care | Payer: BC Managed Care – PPO | Attending: Emergency Medicine | Admitting: Emergency Medicine

## 2020-06-27 DIAGNOSIS — R0789 Other chest pain: Secondary | ICD-10-CM | POA: Diagnosis not present

## 2020-06-27 DIAGNOSIS — R0602 Shortness of breath: Secondary | ICD-10-CM | POA: Insufficient documentation

## 2020-06-27 DIAGNOSIS — R1011 Right upper quadrant pain: Secondary | ICD-10-CM | POA: Diagnosis not present

## 2020-06-27 DIAGNOSIS — R079 Chest pain, unspecified: Secondary | ICD-10-CM

## 2020-06-27 LAB — CBC WITH DIFFERENTIAL/PLATELET
Abs Immature Granulocytes: 0.02 10*3/uL (ref 0.00–0.07)
Basophils Absolute: 0 10*3/uL (ref 0.0–0.1)
Basophils Relative: 1 %
Eosinophils Absolute: 0.1 10*3/uL (ref 0.0–0.5)
Eosinophils Relative: 2 %
HCT: 43.7 % (ref 39.0–52.0)
Hemoglobin: 14.3 g/dL (ref 13.0–17.0)
Immature Granulocytes: 0 %
Lymphocytes Relative: 33 %
Lymphs Abs: 1.9 10*3/uL (ref 0.7–4.0)
MCH: 29.9 pg (ref 26.0–34.0)
MCHC: 32.7 g/dL (ref 30.0–36.0)
MCV: 91.4 fL (ref 80.0–100.0)
Monocytes Absolute: 0.6 10*3/uL (ref 0.1–1.0)
Monocytes Relative: 10 %
Neutro Abs: 3 10*3/uL (ref 1.7–7.7)
Neutrophils Relative %: 54 %
Platelets: 257 10*3/uL (ref 150–400)
RBC: 4.78 MIL/uL (ref 4.22–5.81)
RDW: 11.3 % — ABNORMAL LOW (ref 11.5–15.5)
WBC: 5.5 10*3/uL (ref 4.0–10.5)
nRBC: 0 % (ref 0.0–0.2)

## 2020-06-27 LAB — BASIC METABOLIC PANEL
Anion gap: 12 (ref 5–15)
BUN: 18 mg/dL (ref 6–20)
CO2: 23 mmol/L (ref 22–32)
Calcium: 9.4 mg/dL (ref 8.9–10.3)
Chloride: 103 mmol/L (ref 98–111)
Creatinine, Ser: 0.83 mg/dL (ref 0.61–1.24)
GFR, Estimated: >60 mL/min (ref >60)
Glucose, Bld: 98 mg/dL (ref 70–99)
Potassium: 3.9 mmol/L (ref 3.5–5.1)
Sodium: 138 mmol/L (ref 135–145)

## 2020-06-27 LAB — HEPATIC FUNCTION PANEL
ALT: 30 U/L (ref 0–44)
AST: 28 U/L (ref 15–41)
Albumin: 4.4 g/dL (ref 3.5–5.0)
Alkaline Phosphatase: 73 U/L (ref 38–126)
Bilirubin, Direct: <0.1 mg/dL (ref 0.0–0.2)
Total Bilirubin: 0.7 mg/dL (ref 0.3–1.2)
Total Protein: 7.5 g/dL (ref 6.5–8.1)

## 2020-06-27 LAB — TROPONIN I (HIGH SENSITIVITY)
Troponin I (High Sensitivity): <2 ng/L (ref <18)
Troponin I (High Sensitivity): 3 ng/L (ref <18)

## 2020-06-27 MED ORDER — ONDANSETRON HCL 4 MG/2ML IJ SOLN
4.0000 mg | Freq: Once | INTRAMUSCULAR | Status: DC
  Filled 2020-06-27: qty 2

## 2020-06-27 MED ORDER — OMEPRAZOLE 40 MG PO CPDR: 40.0000 mg | DELAYED_RELEASE_CAPSULE | Freq: Every day | ORAL | 1 refills | Status: AC

## 2020-06-27 MED ORDER — FENTANYL CITRATE (PF) 100 MCG/2ML IJ SOLN
50.0000 ug | Freq: Once | INTRAMUSCULAR | Status: DC
  Filled 2020-06-27: qty 2

## 2020-06-27 NOTE — ED Notes (Signed)
See triage note  Presents with right sided chest discomfort  Describes pain as "burning"  No n/v or SOB

## 2020-06-27 NOTE — ED Notes (Signed)
MD. Katrinka Blazing reviewed both EKG given , it was compared with prior , stemi not called

## 2020-06-27 NOTE — Discharge Instructions (Addendum)
Read over dietary changes noted on your discharge instructions.  This will help prevent some of the chest pain.  Return emergency department worsening

## 2020-06-27 NOTE — ED Triage Notes (Signed)
Pt from home, stated had right sided burning sensation , associated with sob. Pt calm  , collective, stated it kinda felt like he was going to have a panic attack when these symptom arose.

## 2020-06-27 NOTE — ED Provider Notes (Signed)
[facility] Emergency Department Provider Note  ____________________________________________   Event Date/Time   First MD Initiated Contact with Patient 06/27/20 1350     (approximate)  I have reviewed the triage vital signs and the nursing notes.   HISTORY  Chief Complaint Heartburn (Onset today , stated had right sided chest burning sensation )    HPI KAYNAN KLONOWSKI is a 35 y.o. male presents emergency department complaining of right-sided chest burning along with shortness of breath.  Patient states he was getting ready to go pick up takeout food when he had a sharp pain.  States he was not sure if it was a panic attack or something do with his heart.  States he did get nauseated at the time.  No sweating.  Patient does have a history of high cholesterol, and anxiety.  No past medical history of CAD Patient is not a smoker    Past Medical History:  Diagnosis Date  . Allergy    seasonal  . Anal fissure   . Anxiety   . Blood transfusion without reported diagnosis    as a child  . Depression   . High cholesterol   . Seizures (HCC)    last seizure age 75.    Patient Active Problem List   Diagnosis Date Noted  . MDD (major depressive disorder) 04/18/2017  . Seasonal allergies 02/06/2015    Past Surgical History:  Procedure Laterality Date  . MOUTH SURGERY     wisdom teeth extraction    Prior to Admission medications   Medication Sig Start Date End Date Taking? Authorizing Provider  omeprazole (PRILOSEC) 40 MG capsule Take 1 capsule (40 mg total) by mouth daily. 06/27/20  Yes Maurianna Benard, Roselyn Bering, PA-C  albuterol (VENTOLIN HFA) 108 (90 Base) MCG/ACT inhaler Inhale 2 puffs into the lungs every 6 (six) hours as needed for wheezing or shortness of breath. 08/23/19   Janeece Agee, NP  atorvastatin (LIPITOR) 40 MG tablet Take 1 tablet (40 mg total) by mouth daily. 09/02/19   Janeece Agee, NP  montelukast (SINGULAIR) 10 MG tablet Take 1 tablet (10 mg  total) by mouth at bedtime. 08/23/19   Janeece Agee, NP  sertraline (ZOLOFT) 50 MG tablet TAKE 1 TABLET BY MOUTH EVERY DAY 06/20/20   Janeece Agee, NP    Allergies Patient has no known allergies.  Family History  Problem Relation Age of Onset  . Graves' disease Mother   . Lupus Mother   . Anxiety disorder Father   . Healthy Father   . Healthy Sister   . Healthy Brother   . Healthy Maternal Grandmother   . Cancer Maternal Grandfather   . Healthy Brother   . Healthy Sister     Social History Social History   Tobacco Use  . Smoking status: Never Smoker  . Smokeless tobacco: Never Used  Vaping Use  . Vaping Use: Never used  Substance Use Topics  . Alcohol use: Yes    Alcohol/week: 0.0 standard drinks    Comment: occasional  . Drug use: No    Review of Systems  Constitutional: No fever/chills Eyes: No visual changes. ENT: No sore throat. Respiratory: Denies cough Cardiovascular: Positive chest pain Gastrointestinal: Denies abdominal pain Genitourinary: Negative for dysuria. Musculoskeletal: Negative for back pain. Skin: Negative for rash. Psychiatric: no mood changes,     ____________________________________________   PHYSICAL EXAM:  VITAL SIGNS: ED Triage Vitals  Enc Vitals Group     BP 06/27/20 1234 132/83  Pulse Rate 06/27/20 1234 84     Resp 06/27/20 1234 18     Temp 06/27/20 1234 98.5 F (36.9 C)     Temp Source 06/27/20 1234 Oral     SpO2 06/27/20 1234 100 %     Weight 06/27/20 1249 184 lb 15.5 oz (83.9 kg)     Height 06/27/20 1249 6\' 1"  (1.854 m)     Head Circumference --      Peak Flow --      Pain Score 06/27/20 1248 3     Pain Loc --      Pain Edu? --      Excl. in [place]? --     Constitutional: Alert and oriented. Well appearing and in no acute distress. Eyes: Conjunctivae are normal.  Head: Atraumatic. Nose: No congestion/rhinnorhea. Mouth/Throat: Mucous membranes are moist.   Neck:  supple no lymphadenopathy  noted Cardiovascular: Normal rate, regular rhythm. Heart sounds are normal Respiratory: Normal respiratory effort.  No retractions, lungs c t a  Abd: soft nontender bs normal all 4 quad GU: deferred Musculoskeletal: FROM all extremities, warm and well perfused Neurologic:  Normal speech and language.  Skin:  Skin is warm, dry and intact. No rash noted. Psychiatric: Mood and affect are normal. Speech and behavior are normal.  ____________________________________________   LABS (all labs ordered are listed, but only abnormal results are displayed)  Labs Reviewed  CBC WITH DIFFERENTIAL/PLATELET - Abnormal; Notable for the following components:      Result Value   RDW 11.3 (*)    All other components within normal limits  BASIC METABOLIC PANEL  HEPATIC FUNCTION PANEL  TROPONIN I (HIGH SENSITIVITY)  TROPONIN I (HIGH SENSITIVITY)   ____________________________________________   ____________________________________________  RADIOLOGY  Chest x-ray  ____________________________________________   PROCEDURES  Procedure(s) performed: No  Procedures    ____________________________________________   INITIAL IMPRESSION / ASSESSMENT AND PLAN / ED COURSE  Pertinent labs & imaging results that were available during my care of the patient were reviewed by me and considered in my medical decision making (see chart for details).   The patient is 35 year old male presents with chest pain.  Patient does state he had Covid in August, COVID vaccine x2 injections was done in August.  See HPI.  Physical exam shows patient to appear stable.  DDx: Acute MI, nonspecific chest pain, acute cholecystitis, esophagitis  Labs are initially reassuring with a normal troponin, normal CBC, and normal basic metabolic panel  EKG x2 was reviewed by both physicians.  Initially had concerns of a STEMI but STEMI was not called due to the EKG being similar to a previous EKG.  I did add a second  troponin, hepatic panel  Chest x-ray reviewed by me and confirmed by radiology is negative for any acute abnormalities  Ultrasound right upper quadrant does not show any gallstones or acute cholecystitis.  Second troponin and hepatic panel are both normal.  I did explain all the findings to the patient.  He admits to eating Snickers bars and potato chips last night which could add to some GI distress.  He was given a prescription for omeprazole.  Return emergency department worsening.  Follow-up with his regular doctor if not improving in 3 days.  He states he understands.  Is discharged stable condition.  STEELE STRACENER was evaluated in Emergency Department on 06/27/2020 for the symptoms described in the history of present illness. He was evaluated in the context of the global COVID-19 pandemic, which necessitated consideration  that the patient might be at risk for infection with the SARS-CoV-2 virus that causes COVID-19. Institutional protocols and algorithms that pertain to the evaluation of patients at risk for COVID-19 are in a state of rapid change based on information released by regulatory bodies including the CDC and federal and state organizations. These policies and algorithms were followed during the patient's care in the ED.    As part of my medical decision making, I reviewed the following data within the electronic MEDICAL RECORD NUMBER Nursing notes reviewed and incorporated, Labs reviewed , EKG interpreted NSR, Old chart reviewed, Radiograph reviewed , Notes from prior ED visits and [place] Controlled Substance Database  ____________________________________________   FINAL CLINICAL IMPRESSION(S) / ED DIAGNOSES  Final diagnoses:  RUQ pain  Nonspecific chest pain      NEW MEDICATIONS STARTED DURING THIS VISIT:  New Prescriptions   OMEPRAZOLE (PRILOSEC) 40 MG CAPSULE    Take 1 capsule (40 mg total) by mouth daily.     Note:  This document was prepared using Dragon voice recognition  software and may include unintentional dictation errors.    Faythe Ghee, PA-C 06/27/20 1641    Jene Every, MD 06/28/20 [phone]

## 2020-07-13 ENCOUNTER — Other Ambulatory Visit: Payer: Self-pay | Admitting: Registered Nurse

## 2020-07-13 NOTE — Telephone Encounter (Signed)
Requested medication (s) are due for refill today: yes  Requested medication (s) are on the active medication list: yes  Last refill: 06/20/20  Future visit scheduled: no  Notes to clinic: no valid encounter within last 6 months    Requested Prescriptions  Pending Prescriptions Disp Refills   sertraline (ZOLOFT) 50 MG tablet [Pharmacy Med Name: SERTRALINE HCL 50 MG TABLET] 30 tablet 0    Sig: TAKE 1 TABLET BY MOUTH EVERY DAY      Psychiatry:  Antidepressants - SSRI Failed - 07/13/2020  1:28 AM      Failed - Valid encounter within last 6 months    Recent Outpatient Visits           10 months ago Other chest pain   [facility], Gerlene Burdock, NP   1 year ago Snoring   Primary Care at [facility], Meda Coffee, MD   1 year ago Annual physical exam   Primary Care at [facility], Meda Coffee, MD   1 year ago Snoring   Primary Care at [facility], Meda Coffee, MD   1 year ago Poison ivy dermatitis   Primary Care at [facility], Sandria Bales, MD                Passed - Completed PHQ-2 or PHQ-9 in the last 360 days

## 2020-07-14 NOTE — Telephone Encounter (Signed)
Patient is requesting a refill of the following medications: Requested Prescriptions   Pending Prescriptions Disp Refills  . sertraline (ZOLOFT) 50 MG tablet [Pharmacy Med Name: SERTRALINE HCL 50 MG TABLET] 30 tablet 0    Sig: TAKE 1 TABLET BY MOUTH EVERY DAY    Date of patient request:07/14/20 Last office visit: 08/23/19 Date of last refill: 06/20/20 Last refill amount:  Follow up time period per chart: n/a

## 2020-09-15 ENCOUNTER — Other Ambulatory Visit: Payer: Self-pay | Admitting: Registered Nurse

## 2020-09-15 DIAGNOSIS — E782 Mixed hyperlipidemia: Secondary | ICD-10-CM

## 2020-09-15 NOTE — Telephone Encounter (Signed)
Pt needs ov to est with new provider.

## 2020-10-23 ENCOUNTER — Other Ambulatory Visit: Payer: Self-pay | Admitting: Registered Nurse

## 2020-10-23 DIAGNOSIS — E782 Mixed hyperlipidemia: Secondary | ICD-10-CM

## 2021-05-17 ENCOUNTER — Other Ambulatory Visit: Payer: Self-pay | Admitting: Registered Nurse

## 2021-06-28 ENCOUNTER — Other Ambulatory Visit: Payer: Self-pay | Admitting: Registered Nurse

## 2021-08-09 ENCOUNTER — Emergency Department: Payer: BC Managed Care – PPO

## 2021-08-09 ENCOUNTER — Emergency Department
Admission: EM | Admit: 2021-08-09 | Discharge: 2021-08-09 | Disposition: A | Discharge status: Home / Self Care | Payer: BC Managed Care – PPO | Attending: Emergency Medicine | Admitting: Emergency Medicine

## 2021-08-09 DIAGNOSIS — W010XXA Fall on same level from slipping, tripping and stumbling without subsequent striking against object, initial encounter: Secondary | ICD-10-CM | POA: Diagnosis not present

## 2021-08-09 DIAGNOSIS — S42324A Nondisplaced transverse fracture of shaft of humerus, right arm, initial encounter for closed fracture: Secondary | ICD-10-CM | POA: Insufficient documentation

## 2021-08-09 DIAGNOSIS — S4991XA Unspecified injury of right shoulder and upper arm, initial encounter: Secondary | ICD-10-CM | POA: Diagnosis present

## 2021-08-09 MED ORDER — HYDROMORPHONE HCL 1 MG/ML IJ SOLN
1.0000 mg | Freq: Once | INTRAMUSCULAR | Status: AC
  Administered 2021-08-09: 1 mg via INTRAVENOUS
  Filled 2021-08-09: qty 1

## 2021-08-09 MED ORDER — OXYCODONE HCL 5 MG PO TABS
5.0000 mg | ORAL_TABLET | Freq: Three times a day (TID) | ORAL | 0 refills | Status: AC | PRN
Start: 2021-08-09 — End: 2021-08-14

## 2021-08-09 NOTE — ED Notes (Signed)
Pt places on a sling with ed md at bedside  ?

## 2021-08-09 NOTE — ED Triage Notes (Signed)
Pt stated depression history and seizures as a child  ?

## 2021-08-09 NOTE — ED Provider Notes (Addendum)
? ?[facility] ?Provider Note ? ? ? Event Date/Time  ? First MD Initiated Contact with Patient 08/09/21 0830   ?  (approximate) ? ? ?History  ? ?Arm Injury (Right arm pain from fall, deformity humerus, ems gave 100 mcg fentanyl, no loc, no hit head, no thinners, no medical history ) ? ? ?HPI ? ?Ralph Snow is a 36 y.o. male with past medical history of seizure disorder hyperlipidemia presents with a right arm injury.  Patient woke up this morning and was somewhat groggy went to go down the stairs when he slipped and fell.  Fell onto the right arm.  He did not hit his head, fell onto the buttocks and then went down the rest of the stairs.  He endorses pain in the right arm from elbow up to the shoulder.  Denies numbness or tingling in the hand.  Denies neck pain denies other injury. ?  ? ?Past Medical History:  ?Diagnosis Date  ? Allergy   ? seasonal  ? Anal fissure   ? Anxiety   ? Blood transfusion without reported diagnosis   ? as a child  ? Depression   ? High cholesterol   ? Seizures ([place])   ? last seizure age 71.  ? ? ?Patient Active Problem List  ? Diagnosis Date Noted  ? MDD (major depressive disorder) 04/18/2017  ? Seasonal allergies 02/06/2015  ? ? ? ?Physical Exam  ?Triage Vital Signs: ?ED Triage Vitals  ?Enc Vitals Group  ?   BP 08/09/21 0827 111/70  ?   Pulse Rate 08/09/21 0824 80  ?   Resp 08/09/21 0824 17  ?   Temp 08/09/21 0824 98.4 ?F (36.9 ?C)  ?   Temp Source 08/09/21 0824 Oral  ?   SpO2 08/09/21 0824 98 %  ?   Weight 08/09/21 0824 187 lb 6.3 oz (85 kg)  ?   Height 08/09/21 0824 6' (1.829 m)  ?   Head Circumference --   ?   Peak Flow --   ?   Pain Score 08/09/21 0824 8  ?   Pain Loc --   ?   Pain Edu? --   ?   Excl. in [place]? --   ? ? ?Most recent vital signs: ?Vitals:  ? 08/09/21 1138 08/09/21 1251  ?BP: 115/70 118/78  ?Pulse: 85 85  ?Resp: 14 17  ?Temp: 98.1 ?F (36.7 ?C) 98.5 ?F (36.9 ?C)  ?SpO2: 99% 98%  ? ? ? ?General: Awake, no distress.  ?CV:  Good peripheral perfusion.   ?Resp:  Normal effort.  ?Abd:  No distention.  ?Neuro:             Awake, Alert, Oriented x 3  ?No C-spine tenderness ?Chest wall tenderness ?Abdomen is soft and nontender ?Other:  Right upper arm is obviously swollen and tense, able to wiggle fingers, intact thumbs up and okay sign, sensation grossly intact over the first dorsal webspace thumb and fifth digit, 2+ radial pulse ? ? ?ED Results / Procedures / Treatments  ?Labs ?(all labs ordered are listed, but only abnormal results are displayed) ?Labs Reviewed - No data to display ? ? ?EKG ? ? ? ? ?RADIOLOGY ?X-ray of the right humerus reviewed by myself shows midshaft fracture with mild angulation ? ? ?PROCEDURES: ? ?Critical Care performed: No ? ?.Splint Application ? ?Date/Time: 08/09/2021 8:02 PM ?Performed by: Rada Hay, MD ?Authorized by: Rada Hay, MD  ? ?Consent:  ?  Consent obtained:  Verbal ?  Risks discussed:  Pain and swelling ?Universal protocol:  ?  Patient identity confirmed:  Verbally with patient ?Pre-procedure details:  ?  Distal neurologic exam:  Normal ?  Distal perfusion: distal pulses strong   ?Procedure details:  ?  Location:  Arm ?  Arm location:  R upper arm ?  Strapping: no   ?  Splint type:  Double sugar tong ?  Supplies:  Fiberglass ?Post-procedure details:  ?  Distal neurologic exam:  Normal ?  Distal perfusion: distal pulses strong   ?  Procedure completion:  Tolerated ?Comments:  ?   Coaptation splint w/ double sugar tong applied ? ?The patient is on the cardiac monitor to evaluate for evidence of arrhythmia and/or significant heart rate changes. ? ? ?MEDICATIONS ORDERED IN ED: ?Medications  ?HYDROmorphone (DILAUDID) injection 1 mg (1 mg Intravenous Given 08/09/21 0848)  ?HYDROmorphone (DILAUDID) injection 1 mg (1 mg Intravenous Given 08/09/21 1025)  ? ? ? ?IMPRESSION / MDM / ASSESSMENT AND PLAN / ED COURSE  ?I reviewed the triage vital signs and the nursing notes. ?             ?               ? ?Differential diagnosis  includes, but is not limited to, midshaft humerus fracture, supracondylar fracture ? ?Is a 36 year old male presents with an injury to the right arm after a fall.  On exam he has an obviously swollen small right arm with obvious deformity.  He has however neurovascular intact and the compartment is soft.  X-ray obtained showing midshaft fracture of the humerus.  He was given IV Dilaudid for pain control.  Discussed with Dr. Harlow Mares orthopedist on-call who reviewed the imaging and recommended putting him in a coaptation splint and orthopedic follow-up.  Splint was applied.  Will discharge with prescription for oxycodone for breakthrough pain.  Given orthopedic follow-up. ? ?  ? ? ?FINAL CLINICAL IMPRESSION(S) / ED DIAGNOSES  ? ?Final diagnoses:  ?Closed nondisplaced transverse fracture of shaft of right humerus, initial encounter  ? ? ? ?Rx / DC Orders  ? ?ED Discharge Orders   ? ?      Ordered  ?  oxyCODONE (ROXICODONE) 5 MG immediate release tablet  Every 8 hours PRN       ? 08/09/21 1157  ? ?  ?  ? ?  ? ? ? ?Note:  This document was prepared using Dragon voice recognition software and may include unintentional dictation errors. ?  ?Rada Hay, MD ?08/09/21 1139 ? ?  ?Rada Hay, MD ?08/09/21 2003 ? ?

## 2021-08-09 NOTE — ED Triage Notes (Signed)
Right arm pain from fall, deformity humerus, ems gave 100 mcg fentanyl, no loc, no hit head, no thinners, no medical history  ?

## 2021-08-09 NOTE — Discharge Instructions (Addendum)
Please call Dr. Odis Luster office to schedule an appointment to be seen for the next week.  You should take 400 mg of ibuprofen every 6 hours and you can take the oxycodone as needed for breakthrough pain.  Reasons to return to the emergency department include worsening pain numbness or tingling in your extremity. ?

## 2021-08-19 IMAGING — US US ABDOMEN LIMITED RUQ/ASCITES
1 series · 14 of 25 positions shown · non-contrast
Comparison: None.

CLINICAL DATA: Right upper quadrant pain.

EXAM:
ULTRASOUND ABDOMEN LIMITED RIGHT UPPER QUADRANT

[Series 1: us abdomen limited ruq (liver/gb) · 14 of 39 slices shown]
[im 1/39]
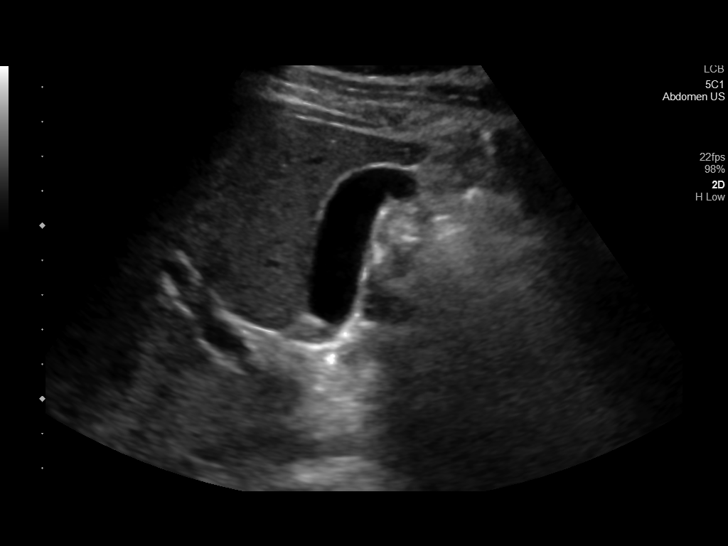
[im 4/39]
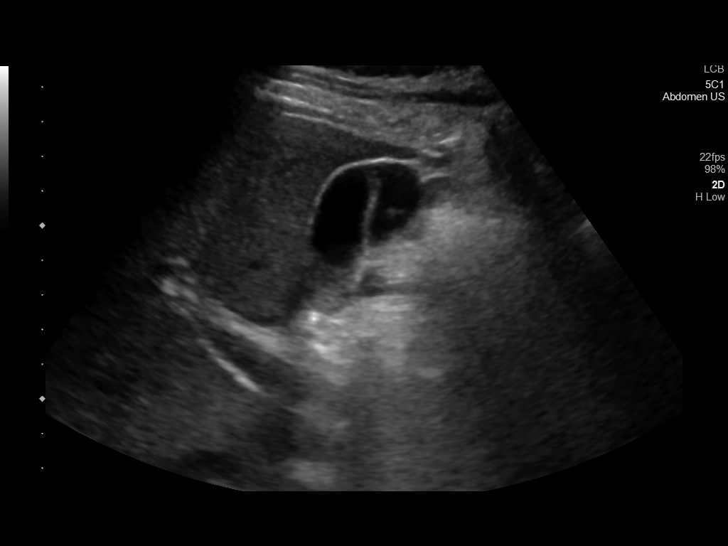
[im 7/39]
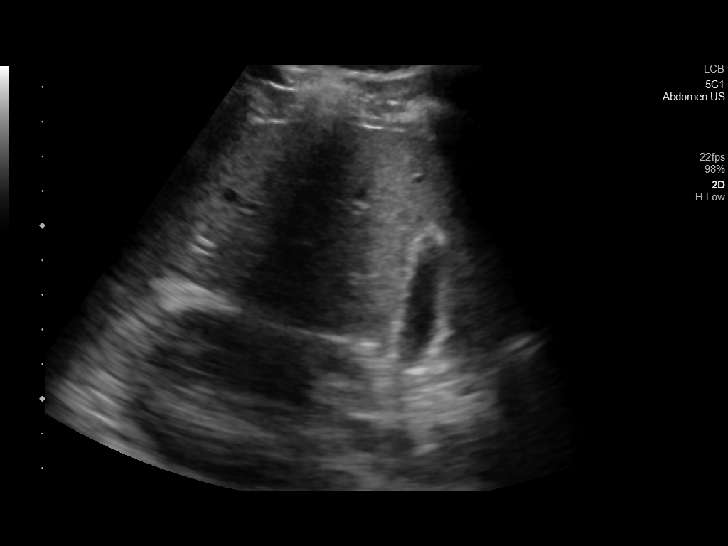
[im 10/39]
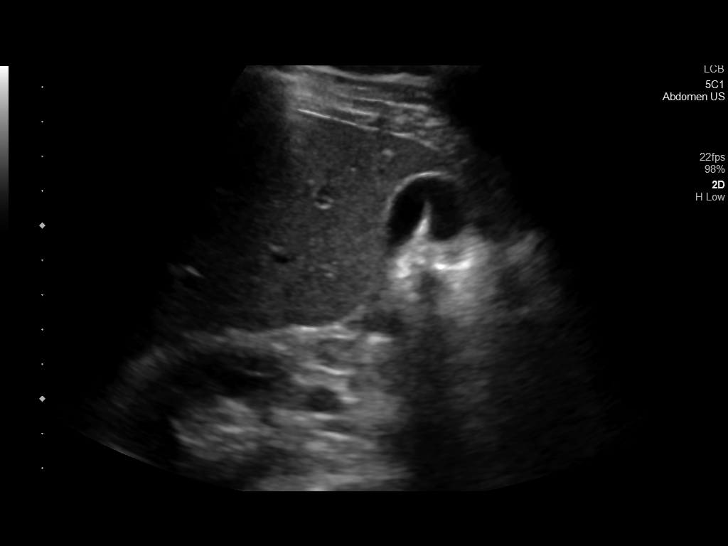
[im 13/39]
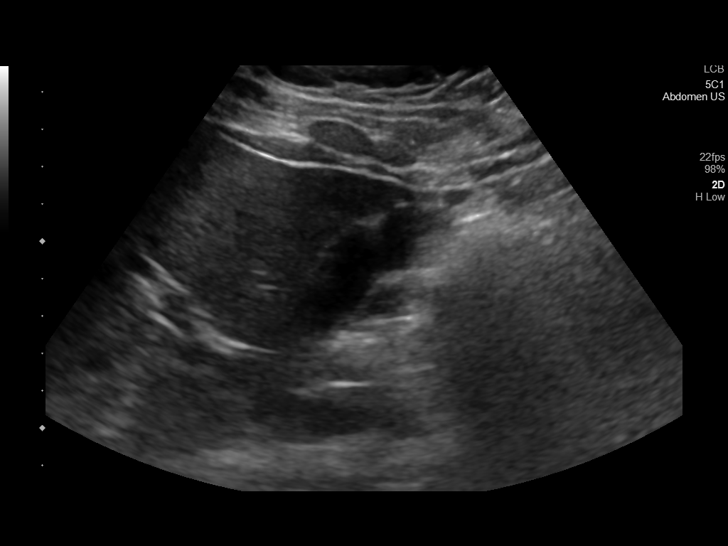
[im 15/39]
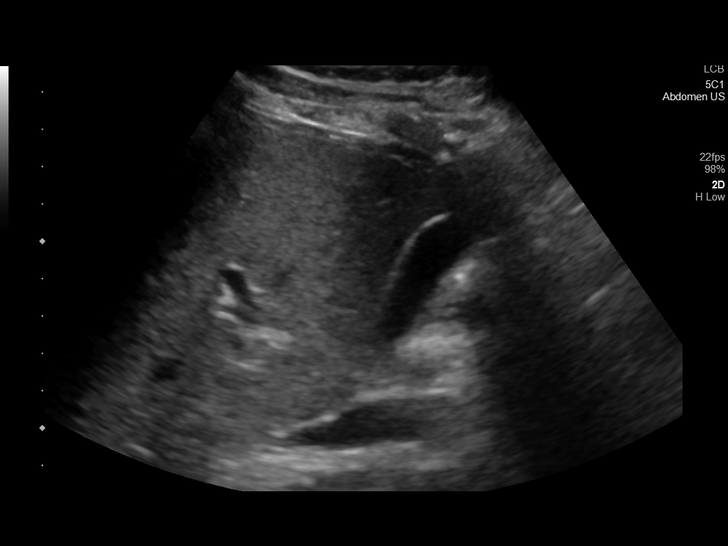
[im 18/39]
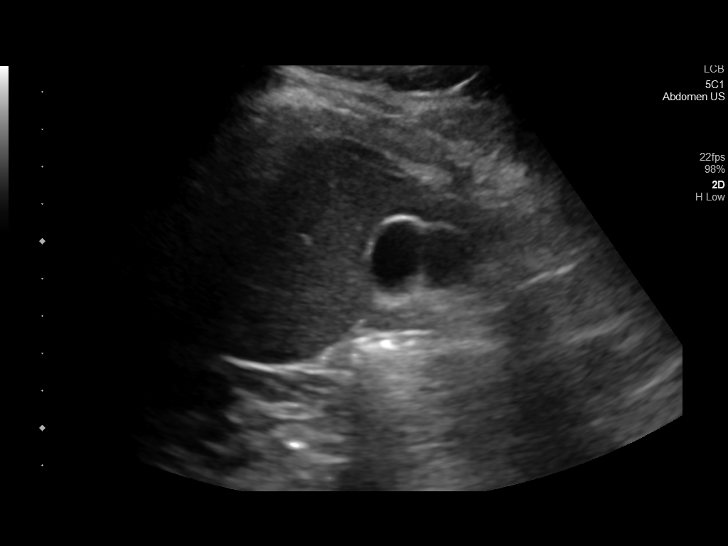
[im 21/39]
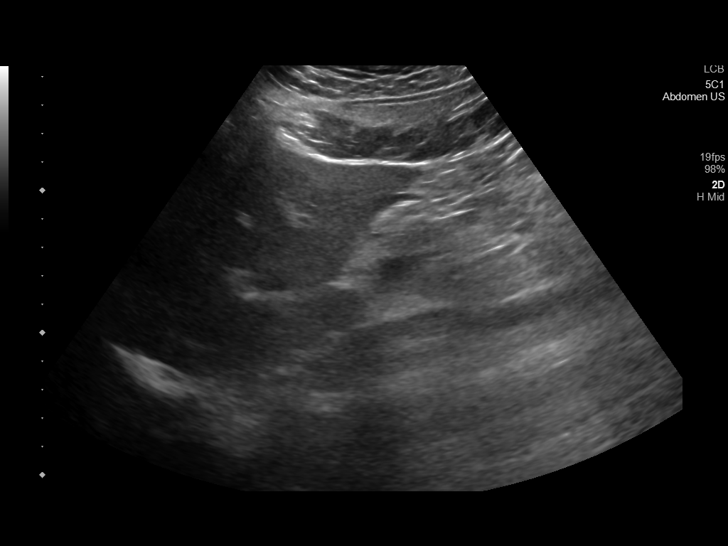
[im 24/39]
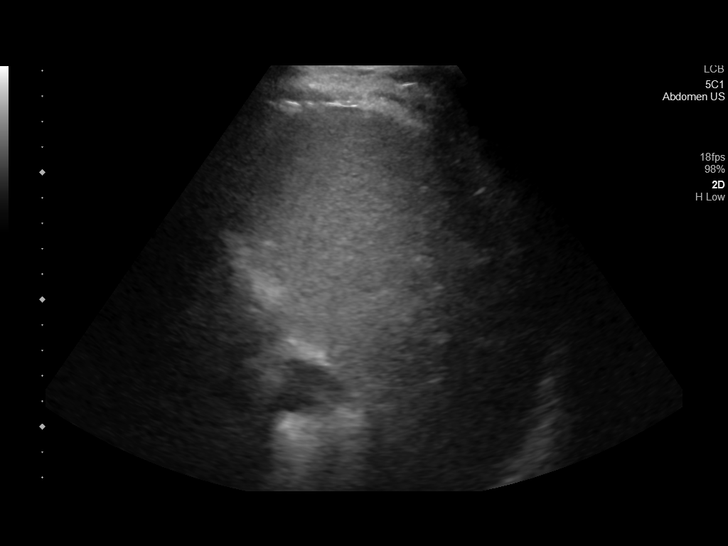
[im 26/39]
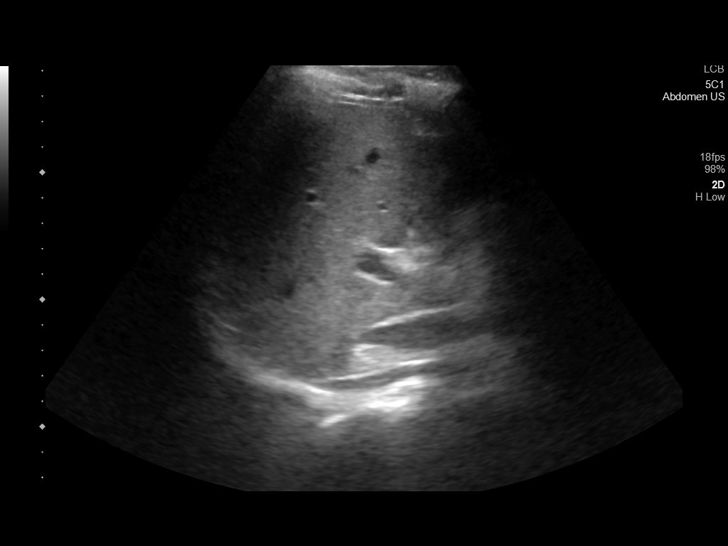
[im 29/39]
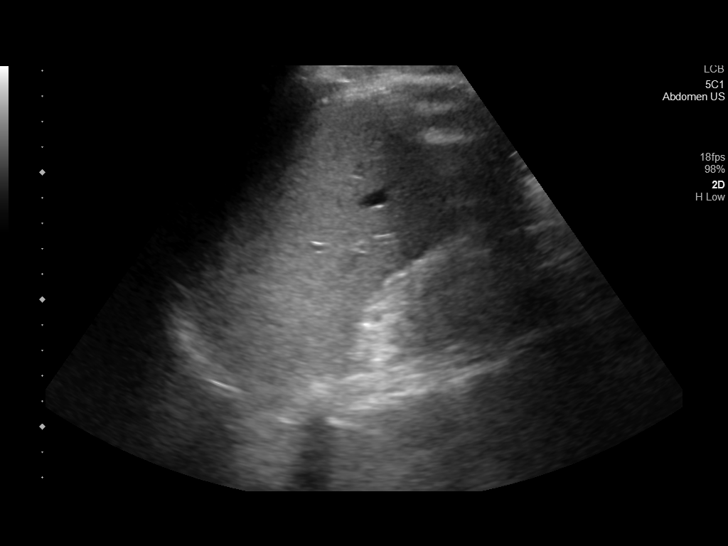
[im 32/39]
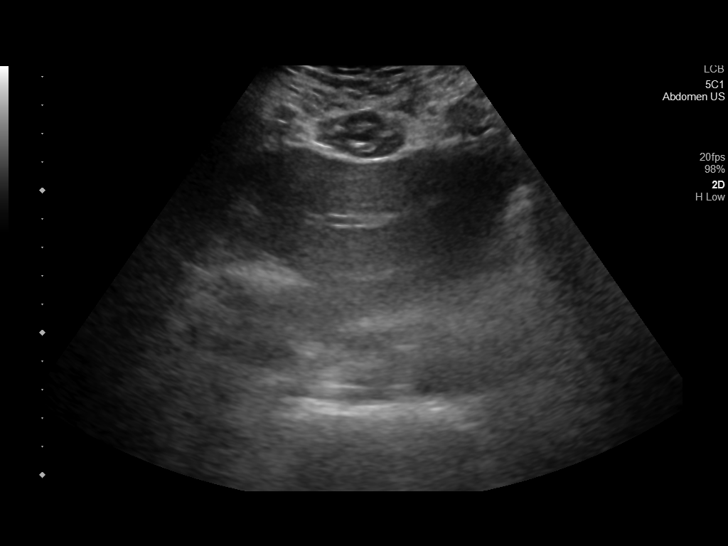
[im 35/39]
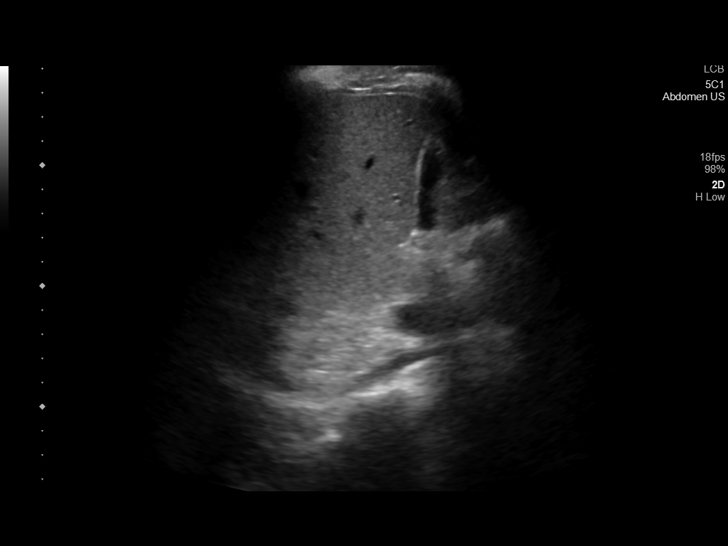
[im 39/39]
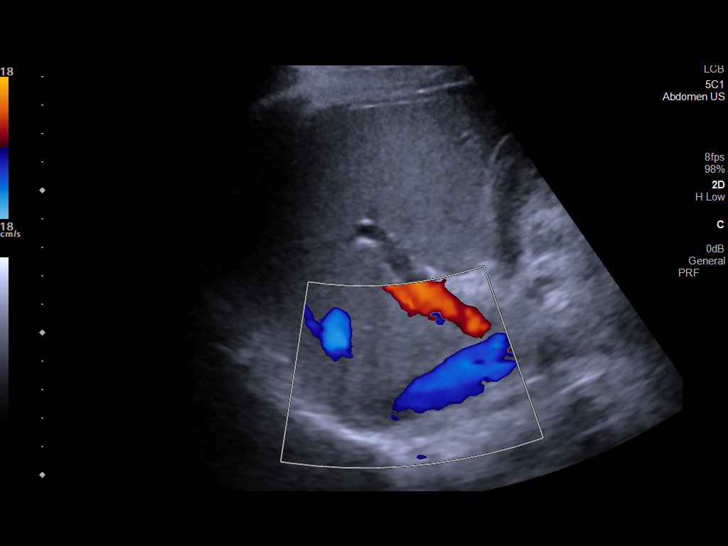

[14 of 25 positions shown; findings below may reference images not displayed]

FINDINGS: Gallbladder:

No gallstones or gallbladder wall thickening. No pericholecystic
fluid. The sonographer reports no sonographic Murphy's sign.

Common bile duct:

Diameter: 4 mm

Liver:

No focal parenchymal abnormality evident. Portal vein is patent on
color Doppler imaging with normal direction of blood flow towards
the liver.

Other: None.
IMPRESSION: No acute findings. No evidence to explain the patient's history of
pain.

## 2022-10-01 IMAGING — DX DG HUMERUS 2V *R*
2 series · 2 of 2 positions shown · non-contrast
Comparison: None.

CLINICAL DATA: Right arm pain after fall

EXAM:
RIGHT HUMERUS - 2+ VIEW

[humerus ap]
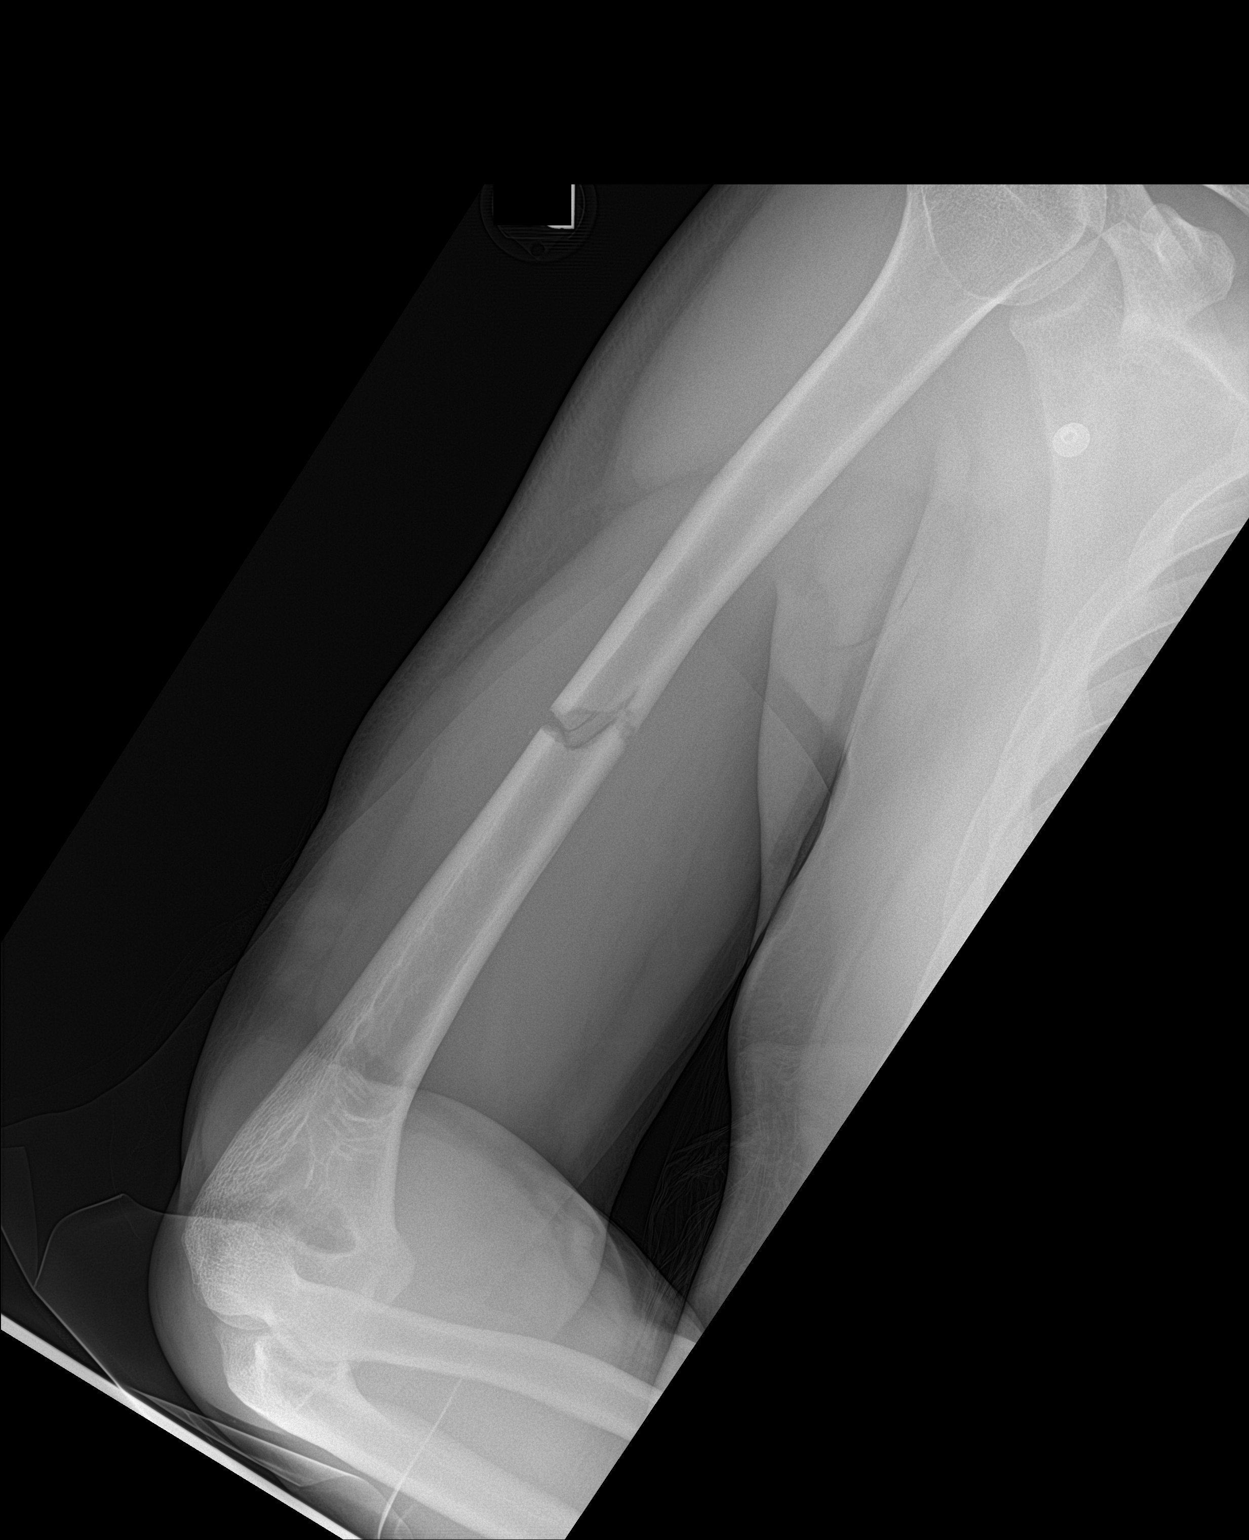

[humerus lat]
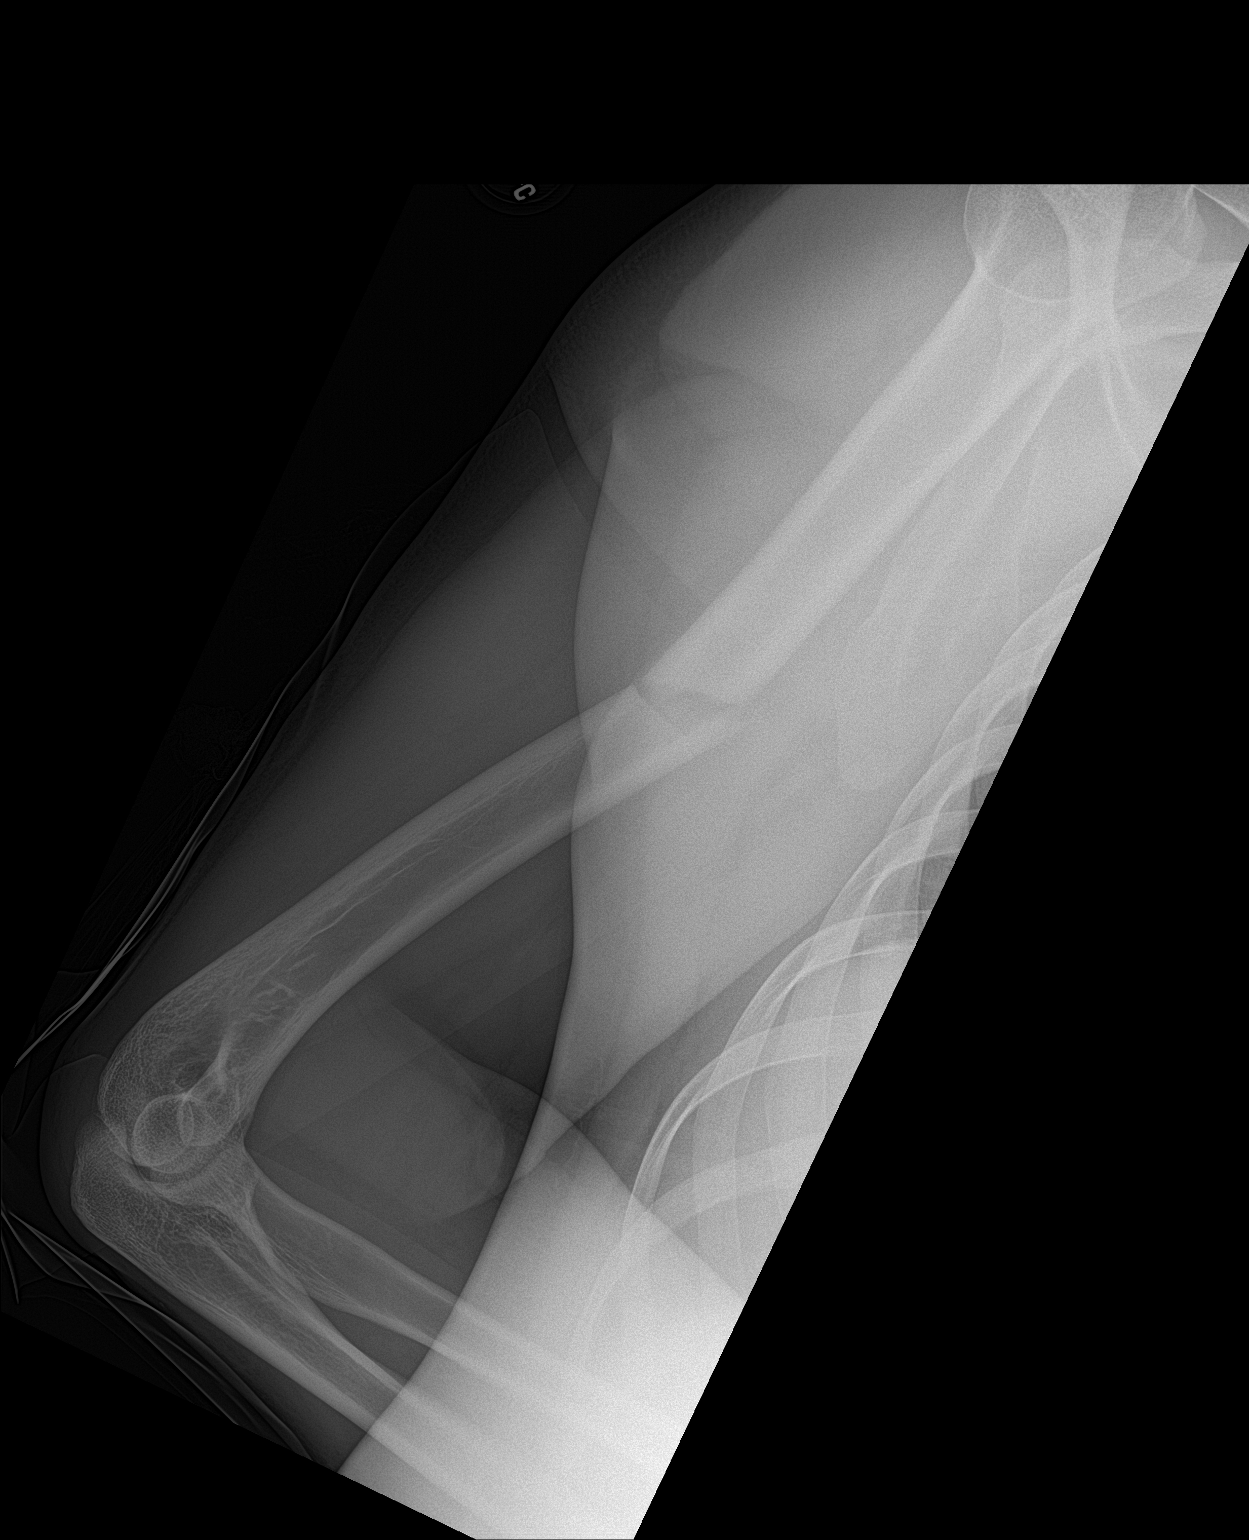

[2 of 2 positions shown; findings below may reference images not displayed]

FINDINGS: Acute minimally comminuted mid diaphyseal fracture of the right
humerus with mild anterior apex angulation. Fracture is slightly
distracted although no significant displacement. Alignment at the
shoulder and elbow appear maintained on the included projections.
Generalized soft tissue swelling.
IMPRESSION: Acute mildly angulated mid diaphyseal fracture of the right humerus.
